# Patient Record
Sex: Male | Born: 1990 | Race: Black or African American | Hispanic: No | Marital: Single | Smoking: Current every day smoker
Health system: Southern US, Community
[De-identification: ages and names within clinical notes are randomized; demographics above are authoritative.]

## PROBLEM LIST (undated history)

## (undated) DIAGNOSIS — Z720 Tobacco use: Secondary | ICD-10-CM

---

## 2011-01-13 ENCOUNTER — Emergency Department (HOSPITAL_COMMUNITY)
Admission: EM | Admit: 2011-01-13 | Discharge: 2011-01-13 | Disposition: A | Discharge status: Home / Self Care | Payer: Self-pay | Attending: Emergency Medicine | Admitting: Emergency Medicine

## 2011-01-13 DIAGNOSIS — Z0389 Encounter for observation for other suspected diseases and conditions ruled out: Secondary | ICD-10-CM | POA: Insufficient documentation

## 2013-06-11 ENCOUNTER — Encounter (HOSPITAL_COMMUNITY): Payer: Self-pay | Admitting: Emergency Medicine

## 2013-06-11 ENCOUNTER — Emergency Department (HOSPITAL_COMMUNITY)
Admission: EM | Admit: 2013-06-11 | Discharge: 2013-06-11 | Disposition: A | Discharge status: Home / Self Care | Payer: BC Managed Care – PPO | Attending: Emergency Medicine | Admitting: Emergency Medicine

## 2013-06-11 DIAGNOSIS — R112 Nausea with vomiting, unspecified: Secondary | ICD-10-CM | POA: Insufficient documentation

## 2013-06-11 DIAGNOSIS — R109 Unspecified abdominal pain: Secondary | ICD-10-CM | POA: Insufficient documentation

## 2013-06-11 DIAGNOSIS — K529 Noninfective gastroenteritis and colitis, unspecified: Secondary | ICD-10-CM

## 2013-06-11 DIAGNOSIS — K5289 Other specified noninfective gastroenteritis and colitis: Secondary | ICD-10-CM | POA: Insufficient documentation

## 2013-06-11 DIAGNOSIS — R197 Diarrhea, unspecified: Secondary | ICD-10-CM

## 2013-06-11 LAB — CBC WITH DIFFERENTIAL/PLATELET
Basophils Absolute: 0 10*3/uL (ref 0.0–0.1)
Basophils Relative: 0 % (ref 0–1)
Eosinophils Absolute: 0 10*3/uL (ref 0.0–0.7)
Eosinophils Relative: 0 % (ref 0–5)
Lymphocytes Relative: 5 % — ABNORMAL LOW (ref 12–46)
MCV: 79 fL (ref 78.0–100.0)
Platelets: 246 10*3/uL (ref 150–400)
RDW: 11.9 % (ref 11.5–15.5)
WBC: 10.6 10*3/uL — ABNORMAL HIGH (ref 4.0–10.5)

## 2013-06-11 LAB — COMPREHENSIVE METABOLIC PANEL
ALT: 11 U/L (ref 0–53)
AST: 22 U/L (ref 0–37)
Albumin: 4.4 g/dL (ref 3.5–5.2)
CO2: 23 mEq/L (ref 19–32)
Calcium: 9.9 mg/dL (ref 8.4–10.5)
Sodium: 135 mEq/L (ref 135–145)
Total Protein: 7.4 g/dL (ref 6.0–8.3)

## 2013-06-11 MED ORDER — ONDANSETRON HCL 4 MG/2ML IJ SOLN
4.0000 mg | Freq: Once | INTRAMUSCULAR | Status: AC
  Administered 2013-06-11: 4 mg via INTRAVENOUS
  Filled 2013-06-11: qty 2

## 2013-06-11 MED ORDER — HYDROMORPHONE HCL PF 1 MG/ML IJ SOLN
1.0000 mg | Freq: Once | INTRAMUSCULAR | Status: AC
  Administered 2013-06-11: 1 mg via INTRAVENOUS
  Filled 2013-06-11: qty 1

## 2013-06-11 MED ORDER — SODIUM CHLORIDE 0.9 % IV BOLUS (SEPSIS)
1000.0000 mL | Freq: Once | INTRAVENOUS | Status: AC
  Administered 2013-06-11: 1000 mL via INTRAVENOUS

## 2013-06-11 MED ORDER — ONDANSETRON 4 MG PO TBDP: 4.0000 mg | ORAL_TABLET | Freq: Three times a day (TID) | ORAL | Status: DC | PRN

## 2013-06-11 MED ORDER — TRIAMCINOLONE ACETONIDE 0.1 % EX CREA: TOPICAL_CREAM | Freq: Two times a day (BID) | CUTANEOUS | Status: DC

## 2013-06-11 NOTE — ED Provider Notes (Signed)
CSN: [id number]     Arrival date & time 06/11/13  0840 History     First MD Initiated Contact with Patient 06/11/13 [phone]     Chief Complaint  Patient presents with  . Nausea  . Emesis  . Abdominal Pain   (Consider location/radiation/quality/duration/timing/severity/associated sxs/prior Treatment) The history is provided by the patient and medical records.   Pt presents to the ED for sudden onset of persistent nausea, non-bloody, non-bilious vomiting, non-bloody diarrhea, and abdominal pain since approximately 2 AM. Pain described as sharp, and causing him to have difficulty finding a comfortable position.  No new medications.  Pt states he did eat some "new food" that his friends mother made-- unsure what it is but states it was "from their country".  No fevers, sweats, chills.  No urinary sx or flank pain.  No prior abdominal surgeries.  No recent EtOH or abx.  Pt also complains of unrelated possible allergic reaction.  States over the past few days he has noticed small, pruritic "bumps" appearing on BUE and trunk.  Pt wears long sleeves at work.  No changes in soaps or detergents.  Questioned [facility] and they deny changes in [employer] as well.  No one at home with similar rash.  No hotel stays.  No tick exposure.     No past medical history on file. No past surgical history on file. No family history on file. History  Substance Use Topics  . Smoking status: Not on file  . Smokeless tobacco: Not on file  . Alcohol Use: Not on file    Review of Systems  Gastrointestinal: Positive for nausea, vomiting, abdominal pain and diarrhea.  All other systems reviewed and are negative.    Allergies  Review of patient's allergies indicates no known allergies.  Home Medications  No current outpatient prescriptions on file. BP 102/63  Pulse 78  Temp(Src) 98.3 F (36.8 C) (Oral)  Resp 20  SpO2 100%  Physical Exam  Nursing note and vitals  reviewed. Constitutional: He is oriented to person, place, and time. He appears well-developed and well-nourished. No distress.  Appears uncomfortable  HENT:  Head: Normocephalic and atraumatic.  Mouth/Throat: Oropharynx is clear and moist.  Eyes: Conjunctivae and EOM are normal. Pupils are equal, round, and reactive to light.  Neck: Normal range of motion.  Cardiovascular: Normal rate, regular rhythm and normal heart sounds.   Pulmonary/Chest: Effort normal and breath sounds normal.  Abdominal: Soft. Normal appearance and bowel sounds are normal. There is tenderness in the right lower quadrant and left lower quadrant. There is no guarding, no CVA tenderness, no tenderness at McBurney's point and negative Murphy's sign.  Musculoskeletal: Normal range of motion.  Neurological: He is alert and oriented to person, place, and time.  Skin: Skin is warm and dry. No ecchymosis and no petechiae noted. No erythema.  Rash consistent with contact dermatitis on BUE and trunk, pruritic; no bites, petechia, or other lesions noted  Psychiatric: He has a normal mood and affect.    ED Course   Procedures (including critical care time)  Labs Reviewed  CBC WITH DIFFERENTIAL - Abnormal; Notable for the following:    WBC 10.6 (*)    Neutrophils Relative % 92 (*)    Neutro Abs 9.8 (*)    Lymphocytes Relative 5 (*)    Lymphs Abs 0.6 (*)    Monocytes Relative 2 (*)    All other components within normal limits  COMPREHENSIVE METABOLIC PANEL - Abnormal; Notable  for the following:    Glucose, Bld 135 (*)    All other components within normal limits  LIPASE, BLOOD   No results found. 1. Gastroenteritis   2. Nausea & vomiting   3. Diarrhea     MDM   Baseline labs, IVF, zofran, and dilaudid ordered.  Will re-assess.  10:29 AM Pt re-assessed, sleeping in bed.  Woke pt, states he is feeling much better, no further vomiting or abdominal pain.  Repeat abdominal exam unremarkable.  Sx likely due to food  related gastroenteritis, labs reassuring.  Will initiate PO challenge.  Pt tolerated PO without difficulty, no recurrent vomiting, VS remained stable. Doubt acute/surgical abdomen.  Pt will be d/c with zofran and triamcinolone for likely contact dermatitis-- instructed he may take OTC benadryl to help with itching.  FU with [facility] as needed.  Discussed plan with pt and mom, they agreed.  Return precautions advised.  Garlon Hatchet, PA-C 06/11/13 1219

## 2013-06-11 NOTE — ED Notes (Signed)
Pt c/o of lower abd pain, nausea, vomiting diarrhea that started this morning. Weakness, lethargic. Denies blood in urine or stool.

## 2013-06-13 NOTE — ED Provider Notes (Signed)
Medical screening examination/treatment/procedure(s) were performed by non-physician practitioner and as supervising physician I was immediately available for consultation/collaboration.  Candyce Churn, MD 06/13/13 [phone]

## 2014-05-17 ENCOUNTER — Emergency Department (HOSPITAL_COMMUNITY)
Admission: EM | Admit: 2014-05-17 | Discharge: 2014-05-17 | Disposition: A | Discharge status: Home / Self Care | Payer: BC Managed Care – PPO | Attending: Emergency Medicine | Admitting: Emergency Medicine

## 2014-05-17 ENCOUNTER — Encounter (HOSPITAL_COMMUNITY): Payer: Self-pay | Admitting: Emergency Medicine

## 2014-05-17 DIAGNOSIS — Z79899 Other long term (current) drug therapy: Secondary | ICD-10-CM | POA: Insufficient documentation

## 2014-05-17 DIAGNOSIS — R197 Diarrhea, unspecified: Secondary | ICD-10-CM | POA: Insufficient documentation

## 2014-05-17 DIAGNOSIS — R1084 Generalized abdominal pain: Secondary | ICD-10-CM | POA: Insufficient documentation

## 2014-05-17 DIAGNOSIS — R112 Nausea with vomiting, unspecified: Secondary | ICD-10-CM | POA: Insufficient documentation

## 2014-05-17 DIAGNOSIS — F17200 Nicotine dependence, unspecified, uncomplicated: Secondary | ICD-10-CM | POA: Insufficient documentation

## 2014-05-17 LAB — CBC WITH DIFFERENTIAL/PLATELET
BASOS ABS: 0 10*3/uL (ref 0.0–0.1)
BASOS PCT: 0 % (ref 0–1)
Eosinophils Absolute: 0 10*3/uL (ref 0.0–0.7)
Eosinophils Relative: 0 % (ref 0–5)
HEMATOCRIT: 43.1 % (ref 39.0–52.0)
Hemoglobin: 14.6 g/dL (ref 13.0–17.0)
LYMPHS PCT: 9 % — AB (ref 12–46)
Lymphs Abs: 0.7 10*3/uL (ref 0.7–4.0)
MCH: 27.2 pg (ref 26.0–34.0)
MCHC: 33.9 g/dL (ref 30.0–36.0)
MCV: 80.3 fL (ref 78.0–100.0)
MONO ABS: 0.2 10*3/uL (ref 0.1–1.0)
Monocytes Relative: 3 % (ref 3–12)
NEUTROS ABS: 6.8 10*3/uL (ref 1.7–7.7)
NEUTROS PCT: 88 % — AB (ref 43–77)
PLATELETS: 253 10*3/uL (ref 150–400)
RBC: 5.37 MIL/uL (ref 4.22–5.81)
RDW: 11.9 % (ref 11.5–15.5)
WBC: 7.7 10*3/uL (ref 4.0–10.5)

## 2014-05-17 LAB — COMPREHENSIVE METABOLIC PANEL
ALBUMIN: 4.8 g/dL (ref 3.5–5.2)
ALT: 13 U/L (ref 0–53)
AST: 26 U/L (ref 0–37)
Alkaline Phosphatase: 55 U/L (ref 39–117)
Anion gap: 18 — ABNORMAL HIGH (ref 5–15)
BILIRUBIN TOTAL: 0.7 mg/dL (ref 0.3–1.2)
BUN: 8 mg/dL (ref 6–23)
CHLORIDE: 99 meq/L (ref 96–112)
CO2: 21 mEq/L (ref 19–32)
CREATININE: 0.92 mg/dL (ref 0.50–1.35)
Calcium: 10.2 mg/dL (ref 8.4–10.5)
GFR calc Af Amer: >90 mL/min (ref >90)
GFR calc non Af Amer: >90 mL/min (ref >90)
Glucose, Bld: 112 mg/dL — ABNORMAL HIGH (ref 70–99)
Potassium: 3.7 mEq/L (ref 3.7–5.3)
SODIUM: 138 meq/L (ref 137–147)
Total Protein: 8 g/dL (ref 6.0–8.3)

## 2014-05-17 LAB — LIPASE, BLOOD: LIPASE: 48 U/L (ref 11–59)

## 2014-05-17 MED ORDER — MORPHINE SULFATE 4 MG/ML IJ SOLN
4.0000 mg | Freq: Once | INTRAMUSCULAR | Status: AC
  Administered 2014-05-17: 4 mg via INTRAVENOUS
  Filled 2014-05-17: qty 1

## 2014-05-17 MED ORDER — ONDANSETRON HCL 4 MG PO TABS: 4.0000 mg | ORAL_TABLET | Freq: Four times a day (QID) | ORAL | Status: DC

## 2014-05-17 MED ORDER — ONDANSETRON HCL 4 MG/2ML IJ SOLN
4.0000 mg | Freq: Once | INTRAMUSCULAR | Status: AC
  Administered 2014-05-17: 4 mg via INTRAVENOUS
  Filled 2014-05-17: qty 2

## 2014-05-17 MED ORDER — SODIUM CHLORIDE 0.9 % IV BOLUS (SEPSIS)
1000.0000 mL | Freq: Once | INTRAVENOUS | Status: AC
  Administered 2014-05-17: 1000 mL via INTRAVENOUS

## 2014-05-17 NOTE — ED Provider Notes (Signed)
CSN: [id number]     Arrival date & time 05/17/14  1143 History   First MD Initiated Contact with Patient 05/17/14 1206     Chief Complaint  Patient presents with  . Vomiting     (Consider location/radiation/quality/duration/timing/severity/associated sxs/prior Treatment) HPI Comments: Patient presents to the emergency department with chief complaint of nausea, vomiting, and diarrhea. He states that he started vomiting last night at around 2 AM. He denies any sick contacts. Denies any fevers or chills. Denies any dysuria. Denies eating or drinking anything irregular. Denies any history of abdominal surgery. States his abdomen hurts all over, but more so in the upper left quadrant. The pain does not radiate. It is constant and cramping.  The history is provided by the patient. No language interpreter was used.    No past medical history on file. No past surgical history on file. History reviewed. No pertinent family history. History  Substance Use Topics  . Smoking status: Current Every Day Smoker  . Smokeless tobacco: Not on file  . Alcohol Use: Yes     Comment: "light social drinking"    Review of Systems  All other systems reviewed and are negative.     Allergies  Review of patient's allergies indicates no known allergies.  Home Medications   Prior to Admission medications   Medication Sig Start Date End Date Taking? Authorizing Provider  ondansetron (ZOFRAN ODT) 4 MG disintegrating tablet Take 1 tablet (4 mg total) by mouth every 8 (eight) hours as needed for nausea. 06/11/13   Garlon Hatchet, PA-C  triamcinolone cream (KENALOG) 0.1 % Apply topically 2 (two) times daily. Do not apply to face. 06/11/13   Garlon Hatchet, PA-C   BP 104/68  Pulse 64  Temp(Src) 99.3 F (37.4 C) (Oral)  Resp 16  SpO2 100% Physical Exam  Nursing note and vitals reviewed. Constitutional: He is oriented to person, place, and time. He appears well-developed and well-nourished.  HENT:  Head:  Normocephalic and atraumatic.  Eyes: Conjunctivae and EOM are normal. Pupils are equal, round, and reactive to light. Right eye exhibits no discharge. Left eye exhibits no discharge. No scleral icterus.  Neck: Normal range of motion. Neck supple. No JVD present.  Cardiovascular: Normal rate, regular rhythm and normal heart sounds.  Exam reveals no gallop and no friction rub.   No murmur heard. Pulmonary/Chest: Effort normal and breath sounds normal. No respiratory distress. He has no wheezes. He has no rales. He exhibits no tenderness.  Abdominal: Soft. He exhibits no distension and no mass. There is tenderness. There is no rebound and no guarding.  Diffuse abdominal discomfort, more so in the upper left quadrant, no pain at McBurney's point, no left lower quadrant tenderness, no Murphy's sign  Musculoskeletal: Normal range of motion. He exhibits no edema and no tenderness.  Neurological: He is alert and oriented to person, place, and time.  Skin: Skin is warm and dry.  Psychiatric: He has a normal mood and affect. His behavior is normal. Judgment and thought content normal.    ED Course  Procedures (including critical care time) Results for orders placed during the hospital encounter of 05/17/14  CBC WITH DIFFERENTIAL      Result Value Ref Range   WBC 7.7  4.0 - 10.5 K/uL   RBC 5.37  4.22 - 5.81 MIL/uL   Hemoglobin 14.6  13.0 - 17.0 g/dL   HCT 14.7  82.9 - 56.2 %   MCV 80.3  78.0 - 100.0 fL  MCH 27.2  26.0 - 34.0 pg   MCHC 33.9  30.0 - 36.0 g/dL   RDW 10.211.9  72.511.5 - 36.615.5 %   Platelets 253  150 - 400 K/uL   Neutrophils Relative % 88 (*) 43 - 77 %   Neutro Abs 6.8  1.7 - 7.7 K/uL   Lymphocytes Relative 9 (*) 12 - 46 %   Lymphs Abs 0.7  0.7 - 4.0 K/uL   Monocytes Relative 3  3 - 12 %   Monocytes Absolute 0.2  0.1 - 1.0 K/uL   Eosinophils Relative 0  0 - 5 %   Eosinophils Absolute 0.0  0.0 - 0.7 K/uL   Basophils Relative 0  0 - 1 %   Basophils Absolute 0.0  0.0 - 0.1 K/uL   COMPREHENSIVE METABOLIC PANEL      Result Value Ref Range   Sodium 138  137 - 147 mEq/L   Potassium 3.7  3.7 - 5.3 mEq/L   Chloride 99  96 - 112 mEq/L   CO2 21  19 - 32 mEq/L   Glucose, Bld 112 (*) 70 - 99 mg/dL   BUN 8  6 - 23 mg/dL   Creatinine, Ser 4.400.92  0.50 - 1.35 mg/dL   Calcium 34.710.2  8.4 - 42.510.5 mg/dL   Total Protein 8.0  6.0 - 8.3 g/dL   Albumin 4.8  3.5 - 5.2 g/dL   AST 26  0 - 37 U/L   ALT 13  0 - 53 U/L   Alkaline Phosphatase 55  39 - 117 U/L   Total Bilirubin 0.7  0.3 - 1.2 mg/dL   GFR calc non Af Amer >90  >90 mL/min   GFR calc Af Amer >90  >90 mL/min   Anion gap 18 (*) 5 - 15  LIPASE, BLOOD      Result Value Ref Range   Lipase 48  11 - 59 U/L     Imaging Review No results found.   EKG Interpretation None      MDM   Final diagnoses:  Nausea vomiting and diarrhea    Patient with nausea, vomiting, diarrhea. Will check labs, give fluids, treat pain, and nausea. Will reassess.  1:43 PM Labs are reassuring. Patient feels better with treatment. Abdomen is benign on reexam. Discharged to home with Zofran.  Seen by and discussed with Dr. Denton LankSteinl.    Roxy Horsemanobert Clarise Chacko, PA-C 05/17/14 1343

## 2014-05-17 NOTE — ED Notes (Signed)
He c/o n/v numerous times since ~0200 today.  He also states he has had three "loose stools" today.  He is in no distress.  He denies fever, nor knowing any other person in his sphere with similar s/s.

## 2014-05-17 NOTE — Discharge Instructions (Signed)
Gastroenteritis Viral gastroenteritis is also known as stomach flu. This condition affects the stomach and intestinal tract. It can cause sudden diarrhea and vomiting. The illness typically lasts 3 to 8 days. Most people develop an immune response that eventually gets rid of the virus. While this natural response develops, the virus can make you quite ill. CAUSES  Many different viruses can cause gastroenteritis, such as rotavirus or noroviruses. You can catch one of these viruses by consuming contaminated food or water. You may also catch a virus by sharing utensils or other personal items with an infected person or by touching a contaminated surface. SYMPTOMS  The most common symptoms are diarrhea and vomiting. These problems can cause a severe loss of body fluids (dehydration) and a body salt (electrolyte) imbalance. Other symptoms may include:  Fever.  Headache.  Fatigue.  Abdominal pain. DIAGNOSIS  Your caregiver can usually diagnose viral gastroenteritis based on your symptoms and a physical exam. A stool sample may also be taken to test for the presence of viruses or other infections. TREATMENT  This illness typically goes away on its own. Treatments are aimed at rehydration. The most serious cases of viral gastroenteritis involve vomiting so severely that you are not able to keep fluids down. In these cases, fluids must be given through an intravenous line (IV). HOME CARE INSTRUCTIONS   Drink enough fluids to keep your urine clear or pale yellow. Drink small amounts of fluids frequently and increase the amounts as tolerated.  Ask your caregiver for specific rehydration instructions.  Avoid:  Foods high in sugar.  Alcohol.  Carbonated drinks.  Tobacco.  Juice.  Caffeine drinks.  Extremely hot or cold fluids.  Fatty, greasy foods.  Too much intake of anything at one time.  Dairy products until 24 to 48 hours after diarrhea stops.  You may consume probiotics.  Probiotics are active cultures of beneficial bacteria. They may lessen the amount and number of diarrheal stools in adults. Probiotics can be found in yogurt with active cultures and in supplements.  Wash your hands well to avoid spreading the virus.  Only take over-the-counter or prescription medicines for pain, discomfort, or fever as directed by your caregiver. Do not give aspirin to children. Antidiarrheal medicines are not recommended.  Ask your caregiver if you should continue to take your regular prescribed and over-the-counter medicines.  Keep all follow-up appointments as directed by your caregiver. SEEK IMMEDIATE MEDICAL CARE IF:   You are unable to keep fluids down.  You do not urinate at least once every 6 to 8 hours.  You develop shortness of breath.  You notice blood in your stool or vomit. This may look like coffee grounds.  You have abdominal pain that increases or is concentrated in one small area (localized).  You have persistent vomiting or diarrhea.  You have a fever.  The patient is a child younger than 3 months, and he or she has a fever.  The patient is a child older than 3 months, and he or she has a fever and persistent symptoms.  The patient is a child older than 3 months, and he or she has a fever and symptoms suddenly get worse.  The patient is a baby, and he or she has no tears when crying. MAKE SURE YOU:   Understand these instructions.  Will watch your condition.  Will get help right away if you are not doing well or get worse. Document Released: 10/24/2005 Document Revised: 01/16/2012 Document Reviewed: 08/10/2011 [employer]  Patient Information ©2015 [employer]. This information is not intended to replace advice given to you by your health care provider. Make sure you discuss any questions you have with your health care provider. ° °

## 2014-05-17 NOTE — ED Provider Notes (Signed)
Medical screening examination/treatment/procedure(s) were conducted as a shared visit with non-physician practitioner(s) and myself.  I personally evaluated the patient during the encounter.  Pt c/o nv. No bilious or bloody emesis. Improved w ivf. abd soft nt. No pain. No recurrent nv.    Suzi RootsKevin E Jericho Alcorn, MD 05/17/14 [phone]

## 2014-05-17 NOTE — ED Notes (Signed)
Initial contact-pt A&Ox4. Moving all extremities equally. Guarding abdomen. C/o N, V started at 0200 this morning. Denies blood in vomit, diarrhea. Has not been around anyone sick. PA at bedside.

## 2014-05-22 ENCOUNTER — Emergency Department (HOSPITAL_COMMUNITY): Payer: BC Managed Care – PPO

## 2014-05-22 ENCOUNTER — Emergency Department (HOSPITAL_COMMUNITY)
Admission: EM | Admit: 2014-05-22 | Discharge: 2014-05-23 | Disposition: A | Discharge status: Home / Self Care | Payer: BC Managed Care – PPO | Attending: Emergency Medicine | Admitting: Emergency Medicine

## 2014-05-22 ENCOUNTER — Encounter (HOSPITAL_COMMUNITY): Payer: Self-pay | Admitting: Emergency Medicine

## 2014-05-22 DIAGNOSIS — K2990 Gastroduodenitis, unspecified, without bleeding: Principal | ICD-10-CM

## 2014-05-22 DIAGNOSIS — F17200 Nicotine dependence, unspecified, uncomplicated: Secondary | ICD-10-CM | POA: Insufficient documentation

## 2014-05-22 DIAGNOSIS — Z79899 Other long term (current) drug therapy: Secondary | ICD-10-CM | POA: Insufficient documentation

## 2014-05-22 DIAGNOSIS — R1013 Epigastric pain: Secondary | ICD-10-CM

## 2014-05-22 DIAGNOSIS — R112 Nausea with vomiting, unspecified: Secondary | ICD-10-CM

## 2014-05-22 DIAGNOSIS — K2970 Gastritis, unspecified, without bleeding: Secondary | ICD-10-CM | POA: Insufficient documentation

## 2014-05-22 LAB — CBC
HEMATOCRIT: 45.2 % (ref 39.0–52.0)
HEMOGLOBIN: 15.9 g/dL (ref 13.0–17.0)
MCH: 27.9 pg (ref 26.0–34.0)
MCHC: 35.2 g/dL (ref 30.0–36.0)
MCV: 79.3 fL (ref 78.0–100.0)
Platelets: 264 10*3/uL (ref 150–400)
RBC: 5.7 MIL/uL (ref 4.22–5.81)
RDW: 11.7 % (ref 11.5–15.5)
WBC: 5 10*3/uL (ref 4.0–10.5)

## 2014-05-22 LAB — COMPREHENSIVE METABOLIC PANEL
ALK PHOS: 54 U/L (ref 39–117)
ALT: 10 U/L (ref 0–53)
ANION GAP: 14 (ref 5–15)
AST: 17 U/L (ref 0–37)
Albumin: 4.8 g/dL (ref 3.5–5.2)
BILIRUBIN TOTAL: 1 mg/dL (ref 0.3–1.2)
BUN: 17 mg/dL (ref 6–23)
CHLORIDE: 97 meq/L (ref 96–112)
CO2: 27 mEq/L (ref 19–32)
Calcium: 10.4 mg/dL (ref 8.4–10.5)
Creatinine, Ser: 1.17 mg/dL (ref 0.50–1.35)
GFR calc non Af Amer: 86 mL/min — ABNORMAL LOW (ref >90)
GLUCOSE: 90 mg/dL (ref 70–99)
POTASSIUM: 3.9 meq/L (ref 3.7–5.3)
Sodium: 138 mEq/L (ref 137–147)
Total Protein: 8.4 g/dL — ABNORMAL HIGH (ref 6.0–8.3)

## 2014-05-22 LAB — LIPASE, BLOOD: Lipase: 76 U/L — ABNORMAL HIGH (ref 11–59)

## 2014-05-22 MED ORDER — PANTOPRAZOLE SODIUM 40 MG IV SOLR
40.0000 mg | Freq: Once | INTRAVENOUS | Status: AC
  Administered 2014-05-23: 40 mg via INTRAVENOUS
  Filled 2014-05-22: qty 40

## 2014-05-22 MED ORDER — PANTOPRAZOLE SODIUM 20 MG PO TBEC: 20.0000 mg | DELAYED_RELEASE_TABLET | Freq: Every day | ORAL | Status: DC

## 2014-05-22 MED ORDER — DICYCLOMINE HCL 10 MG/ML IM SOLN
20.0000 mg | Freq: Once | INTRAMUSCULAR | Status: AC
  Administered 2014-05-22: 20 mg via INTRAMUSCULAR
  Filled 2014-05-22: qty 2

## 2014-05-22 MED ORDER — SODIUM CHLORIDE 0.9 % IV BOLUS (SEPSIS)
1000.0000 mL | Freq: Once | INTRAVENOUS | Status: AC
  Administered 2014-05-22: 1000 mL via INTRAVENOUS

## 2014-05-22 MED ORDER — IOHEXOL 300 MG/ML  SOLN
100.0000 mL | Freq: Once | INTRAMUSCULAR | Status: AC | PRN
  Administered 2014-05-22: 100 mL via INTRAVENOUS

## 2014-05-22 MED ORDER — ONDANSETRON 4 MG PO TBDP: ORAL_TABLET | ORAL | Status: DC

## 2014-05-22 MED ORDER — ONDANSETRON HCL 4 MG/2ML IJ SOLN
4.0000 mg | Freq: Once | INTRAMUSCULAR | Status: AC
  Administered 2014-05-22: 4 mg via INTRAVENOUS
  Filled 2014-05-22: qty 2

## 2014-05-22 NOTE — Discharge Instructions (Signed)
Take protonix as directed. Take zofran as directed as needed for nausea.  Gastritis, Adult Gastritis is soreness and swelling (inflammation) of the lining of the stomach. Gastritis can develop as a sudden onset (acute) or long-term (chronic) condition. If gastritis is not treated, it can lead to stomach bleeding and ulcers. CAUSES  Gastritis occurs when the stomach lining is weak or damaged. Digestive juices from the stomach then inflame the weakened stomach lining. The stomach lining may be weak or damaged due to viral or bacterial infections. One common bacterial infection is the Helicobacter pylori infection. Gastritis can also result from excessive alcohol consumption, taking certain medicines, or having too much acid in the stomach.  SYMPTOMS  In some cases, there are no symptoms. When symptoms are present, they may include:  Pain or a burning sensation in the upper abdomen.  Nausea.  Vomiting.  An uncomfortable feeling of fullness after eating. DIAGNOSIS  Your caregiver may suspect you have gastritis based on your symptoms and a physical exam. To determine the cause of your gastritis, your caregiver may perform the following:  Blood or stool tests to check for the H pylori bacterium.  Gastroscopy. A thin, flexible tube (endoscope) is passed down the esophagus and into the stomach. The endoscope has a light and camera on the end. Your caregiver uses the endoscope to view the inside of the stomach.  Taking a tissue sample (biopsy) from the stomach to examine under a microscope. TREATMENT  Depending on the cause of your gastritis, medicines may be prescribed. If you have a bacterial infection, such as an H pylori infection, antibiotics may be given. If your gastritis is caused by too much acid in the stomach, H2 blockers or antacids may be given. Your caregiver may recommend that you stop taking aspirin, ibuprofen, or other nonsteroidal anti-inflammatory drugs (NSAIDs). HOME CARE  INSTRUCTIONS  Only take over-the-counter or prescription medicines as directed by your caregiver.  If you were given antibiotic medicines, take them as directed. Finish them even if you start to feel better.  Drink enough fluids to keep your urine clear or pale yellow.  Avoid foods and drinks that make your symptoms worse, such as:  Caffeine or alcoholic drinks.  Chocolate.  Peppermint or mint flavorings.  Garlic and onions.  Spicy foods.  Citrus fruits, such as oranges, lemons, or limes.  Tomato-based foods such as sauce, chili, salsa, and pizza.  Fried and fatty foods.  Eat small, frequent meals instead of large meals. SEEK IMMEDIATE MEDICAL CARE IF:   You have black or dark red stools.  You vomit blood or material that looks like coffee grounds.  You are unable to keep fluids down.  Your abdominal pain gets worse.  You have a fever.  You do not feel better after 1 week.  You have any other questions or concerns. MAKE SURE YOU:  Understand these instructions.  Will watch your condition.  Will get help right away if you are not doing well or get worse. Document Released: 10/18/2001 Document Revised: 04/24/2012 Document Reviewed: 12/07/2011 [facility] Patient Information 2015 [place], [place]. This information is not intended to replace advice given to you by your health care provider. Make sure you discuss any questions you have with your health care provider.  Abdominal Pain Many things can cause abdominal pain. Usually, abdominal pain is not caused by a disease and will improve without treatment. It can often be observed and treated at home. Your health care provider will do a physical exam and  possibly order blood tests and X-rays to help determine the seriousness of your pain. However, in many cases, more time must pass before a clear cause of the pain can be found. Before that point, your health care provider may not know if you need more testing or further  treatment. HOME CARE INSTRUCTIONS  Monitor your abdominal pain for any changes. The following actions may help to alleviate any discomfort you are experiencing:  Only take over-the-counter or prescription medicines as directed by your health care provider.  Do not take laxatives unless directed to do so by your health care provider.  Try a clear liquid diet (broth, tea, or water) as directed by your health care provider. Slowly move to a bland diet as tolerated. SEEK MEDICAL CARE IF:  You have unexplained abdominal pain.  You have abdominal pain associated with nausea or diarrhea.  You have pain when you urinate or have a bowel movement.  You experience abdominal pain that wakes you in the night.  You have abdominal pain that is worsened or improved by eating food.  You have abdominal pain that is worsened with eating fatty foods.  You have a fever. SEEK IMMEDIATE MEDICAL CARE IF:   Your pain does not go away within 2 hours.  You keep throwing up (vomiting).  Your pain is felt only in portions of the abdomen, such as the right side or the left lower portion of the abdomen.  You pass bloody or black tarry stools. MAKE SURE YOU:  Understand these instructions.   Will watch your condition.   Will get help right away if you are not doing well or get worse.  Document Released: 08/03/2005 Document Revised: 10/29/2013 Document Reviewed: 07/03/2013 [facility] Patient Information 2015 [employer], [place]. This information is not intended to replace advice given to you by your health care provider. Make sure you discuss any questions you have with your health care provider.

## 2014-05-22 NOTE — ED Notes (Signed)
Pt states he was here last Saturday with same symptoms of vomiting. Pt states for the past week he has been unable to keep any foods or liquids down. States he has been vomiting 4 times a day everyday for a week. Pt reports he was given zofran to take at home and med was ineffective. Pt states he is having pain in his abdomen in which he rates 5/10.

## 2014-05-22 NOTE — ED Provider Notes (Signed)
CSN: [id number]     Arrival date & time 05/22/14  1954 History   First MD Initiated Contact with Patient 05/22/14 2005     Chief Complaint  Patient presents with  . Emesis     (Consider location/radiation/quality/duration/timing/severity/associated sxs/prior Treatment) HPI Comments: 23 year old male with no significant past medical history presents to the emergency department complaining of continued abdominal pain, nausea and vomiting x5 days. Patient was seen in the emergency Department 5 days ago for the same symptoms. He had a normal workup, was rehydrated and sent home with Zofran. Patient states he has been experiencing intermittent cramping, mid epigastric abdominal pain, nonradiating with associated nausea and vomiting. States he has had about 4-5 episodes of nonbloody, nonbilious emesis over the past 5 days. He tried to use Zofran with no relief. Pain currently related 5/10. Denies fever, chills, diarrhea. No contacts with similar symptoms. He had a bowl of oatmeal followed by a bottle of water earlier today, however vomited shortly after. Denies any alcohol use.  Patient is a 23 y.o. male presenting with vomiting. The history is provided by the patient.  Emesis Associated symptoms: abdominal pain     History reviewed. No pertinent past medical history. History reviewed. No pertinent past surgical history. History reviewed. No pertinent family history. History  Substance Use Topics  . Smoking status: Current Every Day Smoker  . Smokeless tobacco: Not on file  . Alcohol Use: Yes     Comment: "light social drinking"    Review of Systems  Gastrointestinal: Positive for nausea, vomiting and abdominal pain.  All other systems reviewed and are negative.     Allergies  Review of patient's allergies indicates no known allergies.  Home Medications   Prior to Admission medications   Medication Sig Start Date End Date Taking? Authorizing Provider  ondansetron (ZOFRAN) 4 MG  tablet Take 1 tablet (4 mg total) by mouth every 6 (six) hours. 05/17/14  Yes Roxy Horsemanobert Browning, PA-C  ondansetron (ZOFRAN ODT) 4 MG disintegrating tablet 4mg  ODT q4 hours prn nausea/vomit 05/22/14   Trevor Maceobyn M Albert, PA-C  pantoprazole (PROTONIX) 20 MG tablet Take 1 tablet (20 mg total) by mouth daily. 05/22/14   Trevor Maceobyn M Albert, PA-C   BP 92/73  Pulse 87  Temp(Src) 98.7 F (37.1 C) (Oral)  Resp 18  SpO2 100% Physical Exam  Nursing note and vitals reviewed. Constitutional: He is oriented to person, place, and time. He appears well-developed and well-nourished. No distress.  HENT:  Head: Normocephalic and atraumatic.  Mouth/Throat: Oropharynx is clear and moist.  Eyes: Conjunctivae are normal.  Neck: Normal range of motion. Neck supple.  Cardiovascular: Normal rate, regular rhythm and normal heart sounds.   Pulmonary/Chest: Effort normal and breath sounds normal.  Abdominal: Soft. Normal appearance and bowel sounds are normal. There is generalized tenderness. There is no rigidity, no rebound, no guarding, no tenderness at McBurney's point and negative Murphy's sign.  Generalized abdominal tenderness, worse in midepigastric. No peritoneal signs.  Musculoskeletal: Normal range of motion. He exhibits no edema.  Neurological: He is alert and oriented to person, place, and time.  Skin: Skin is warm and dry. He is not diaphoretic.  Psychiatric: He has a normal mood and affect. His behavior is normal.    ED Course  Procedures (including critical care time) Labs Review Labs Reviewed  COMPREHENSIVE METABOLIC PANEL - Abnormal; Notable for the following:    Total Protein 8.4 (*)    GFR calc non Af Amer 86 (*)  All other components within normal limits  LIPASE, BLOOD - Abnormal; Notable for the following:    Lipase 76 (*)    All other components within normal limits  CBC    Imaging Review Ct Abdomen Pelvis W Contrast  05/22/2014   CLINICAL DATA:  Vomiting.  EXAM: CT ABDOMEN AND PELVIS WITH  CONTRAST  TECHNIQUE: Multidetector CT imaging of the abdomen and pelvis was performed using the standard protocol following bolus administration of intravenous contrast.  CONTRAST:  OMNIPAQUE IOHEXOL 300 MG/ML  SOLN  COMPARISON:  None.  FINDINGS: LUNG BASES: Included view of the lung bases are clear. Visualized heart and pericardium are unremarkable.  SOLID ORGANS: The liver, spleen, gallbladder, pancreas and adrenal glands are unremarkable.  GASTROINTESTINAL TRACT: Mildly thickened gastric cardia without inflammation. The small and large bowel are normal in course and caliber without inflammatory changes. Enteric contrast has not yet reached the distal small bowel. Normal retrocecal appendix.  KIDNEYS/ URINARY TRACT: Kidneys are orthotopic, demonstrating symmetric enhancement. No hydronephrosis or solid renal masses. 4 mm right interpolar nephrolithiasis. The unopacified ureters are normal in course and caliber. Urinary bladder is partially distended and unremarkable.  PERITONEUM/RETROPERITONEUM: No intraperitoneal free fluid nor free air. Aortoiliac vessels are normal in course and caliber. No lymphadenopathy by CT size criteria. Internal reproductive organs are unremarkable.  SOFT TISSUE/OSSEOUS STRUCTURES: Nonsuspicious.  IMPRESSION: Mildly thickened gastric cardia could reflect redundancy or possible gastritis.  Nonobstructing 4 mm right interpolar nephrolithiasis.   Electronically Signed   By: Awilda Metro   On: 05/22/2014 23:39     EKG Interpretation None      MDM   Final diagnoses:  Gastritis  Epigastric pain  Nausea and vomiting, vomiting of unspecified type   Patient presenting back to the emergency department with abdominal pain, nausea and vomiting. He is nontoxic appearing and in no apparent distress. Afebrile, vital signs stable. Abdomen was generalized tenderness, worse in midepigastric, no peritoneal signs. Plan to give IV fluids, control pain and nausea and recheck  labs. 12:00 AM Labs showing increased lipase from last visit, elevated from 48 to 76. CT obtained to evaluate for possible developing pancreatitis. CT scan showing mildly thickened gastric cardia possibly reflecting gastritis. Given patient's symptoms, this is most likely. Patient is tolerating PO. Repeat abdominal exam is improved from initial. Stable for discharge, protonix given through IV, will discharge with protonix and Zofran. Return precautions given. Patient states understanding of treatment care plan and is agreeable.  Trevor Mace, PA-C 05/23/14 0002

## 2014-05-25 NOTE — ED Provider Notes (Signed)
Medical screening examination/treatment/procedure(s) were performed by non-physician practitioner and as supervising physician I was immediately available for consultation/collaboration.  Devere Brem T Rozelle Caudle, MD 05/25/14 0804 

## 2015-03-07 ENCOUNTER — Encounter (HOSPITAL_COMMUNITY): Payer: Self-pay

## 2015-03-07 ENCOUNTER — Emergency Department (HOSPITAL_COMMUNITY)
Admission: EM | Admit: 2015-03-07 | Discharge: 2015-03-08 | Disposition: A | Discharge status: Home / Self Care | Payer: BLUE CROSS/BLUE SHIELD | Attending: Emergency Medicine | Admitting: Emergency Medicine

## 2015-03-07 DIAGNOSIS — Z720 Tobacco use: Secondary | ICD-10-CM | POA: Diagnosis not present

## 2015-03-07 DIAGNOSIS — R1013 Epigastric pain: Secondary | ICD-10-CM

## 2015-03-07 DIAGNOSIS — R109 Unspecified abdominal pain: Secondary | ICD-10-CM | POA: Diagnosis present

## 2015-03-07 DIAGNOSIS — Z79899 Other long term (current) drug therapy: Secondary | ICD-10-CM | POA: Diagnosis not present

## 2015-03-07 DIAGNOSIS — R1084 Generalized abdominal pain: Secondary | ICD-10-CM | POA: Diagnosis not present

## 2015-03-07 DIAGNOSIS — R112 Nausea with vomiting, unspecified: Secondary | ICD-10-CM

## 2015-03-07 LAB — CBC WITH DIFFERENTIAL/PLATELET
BASOS ABS: 0 10*3/uL (ref 0.0–0.1)
Basophils Relative: 0 % (ref 0–1)
EOS PCT: 0 % (ref 0–5)
Eosinophils Absolute: 0 10*3/uL (ref 0.0–0.7)
HEMATOCRIT: 44.5 % (ref 39.0–52.0)
HEMOGLOBIN: 14.7 g/dL (ref 13.0–17.0)
LYMPHS PCT: 24 % (ref 12–46)
Lymphs Abs: 1.2 10*3/uL (ref 0.7–4.0)
MCH: 27.6 pg (ref 26.0–34.0)
MCHC: 33 g/dL (ref 30.0–36.0)
MCV: 83.6 fL (ref 78.0–100.0)
MONO ABS: 0.2 10*3/uL (ref 0.1–1.0)
MONOS PCT: 5 % (ref 3–12)
Neutro Abs: 3.5 10*3/uL (ref 1.7–7.7)
Neutrophils Relative %: 71 % (ref 43–77)
Platelets: 217 10*3/uL (ref 150–400)
RBC: 5.32 MIL/uL (ref 4.22–5.81)
RDW: 12 % (ref 11.5–15.5)
WBC: 4.9 10*3/uL (ref 4.0–10.5)

## 2015-03-07 LAB — COMPREHENSIVE METABOLIC PANEL
ALT: 10 U/L (ref 0–53)
ANION GAP: 6 (ref 5–15)
AST: 24 U/L (ref 0–37)
Albumin: 5 g/dL (ref 3.5–5.2)
Alkaline Phosphatase: 60 U/L (ref 39–117)
BILIRUBIN TOTAL: 1 mg/dL (ref 0.3–1.2)
BUN: 11 mg/dL (ref 6–23)
CALCIUM: 9.5 mg/dL (ref 8.4–10.5)
CHLORIDE: 104 mmol/L (ref 96–112)
CO2: 28 mmol/L (ref 19–32)
CREATININE: 1.18 mg/dL (ref 0.50–1.35)
GFR, EST NON AFRICAN AMERICAN: 85 mL/min — AB (ref >90)
Glucose, Bld: 105 mg/dL — ABNORMAL HIGH (ref 70–99)
Potassium: 4.1 mmol/L (ref 3.5–5.1)
Sodium: 138 mmol/L (ref 135–145)
Total Protein: 7.8 g/dL (ref 6.0–8.3)

## 2015-03-07 MED ORDER — ONDANSETRON HCL 4 MG/2ML IJ SOLN
4.0000 mg | Freq: Once | INTRAMUSCULAR | Status: AC
  Administered 2015-03-07: 4 mg via INTRAVENOUS
  Filled 2015-03-07: qty 2

## 2015-03-07 MED ORDER — SODIUM CHLORIDE 0.9 % IV BOLUS (SEPSIS)
1000.0000 mL | Freq: Once | INTRAVENOUS | Status: AC
  Administered 2015-03-07: 1000 mL via INTRAVENOUS

## 2015-03-07 MED ORDER — MORPHINE SULFATE 4 MG/ML IJ SOLN
4.0000 mg | Freq: Once | INTRAMUSCULAR | Status: AC
  Administered 2015-03-07: 4 mg via INTRAVENOUS
  Filled 2015-03-07: qty 1

## 2015-03-07 NOTE — ED Provider Notes (Signed)
CSN: [id number]     Arrival date & time 03/07/15  2156 History   First MD Initiated Contact with Patient 03/07/15 2229     Chief Complaint  Patient presents with  . Abdominal Pain  . Emesis     (Consider location/radiation/quality/duration/timing/severity/associated sxs/prior Treatment) HPI Comments: Patient presents today with a chief complaint of abdominal pain, nausea, and vomiting.  He reports that symptoms began 2 days ago.  He reports that the pain is diffuse, but worse of the epigastric area.  Pain does not radiate.  He has not taken anything for symptoms prior to arrival.  He denies fever, chills, diarrhea, constipation, urinary symptoms, or scrotal pain/swelling.  Last BM was just prior to arrival, which he reports was normal.  No prior history of abdominal surgeries.    Patient is a 24 y.o. male presenting with abdominal pain and vomiting. The history is provided by the patient.  Abdominal Pain Associated symptoms: vomiting   Emesis Associated symptoms: abdominal pain     History reviewed. No pertinent past medical history. History reviewed. No pertinent past surgical history. No family history on file. History  Substance Use Topics  . Smoking status: Current Every Day Smoker  . Smokeless tobacco: Not on file  . Alcohol Use: Yes     Comment: "light social drinking"    Review of Systems  Gastrointestinal: Positive for vomiting and abdominal pain.  All other systems reviewed and are negative.     Allergies  Review of patient's allergies indicates no known allergies.  Home Medications   Prior to Admission medications   Medication Sig Start Date End Date Taking? Authorizing Provider  ondansetron (ZOFRAN ODT) 4 MG disintegrating tablet  ODT q4 hours prn nausea/vomit 05/22/14   Robyn M Hess, PA-C  ondansetron (ZOFRAN) 4 MG tablet Take 1 tablet (4 mg total) by mouth every 6 (six) hours. 05/17/14   Roxy Horseman, PA-C  pantoprazole (PROTONIX) 20 MG tablet Take 1  tablet (20 mg total) by mouth daily. 05/22/14   Robyn M Hess, PA-C   BP 124/74 mmHg  Pulse 71  Temp(Src) 98.2 F (36.8 C) (Oral)  Resp 18  SpO2 100% Physical Exam  Constitutional: He appears well-developed and well-nourished.  HENT:  Head: Normocephalic and atraumatic.  Cardiovascular: Normal rate, regular rhythm and normal heart sounds.   Pulmonary/Chest: Effort normal and breath sounds normal.  Abdominal: Soft. Bowel sounds are normal. He exhibits no distension and no mass. There is tenderness. There is no rebound, no guarding, no tenderness at McBurney's point and negative Murphy's sign.  Diffuse tenderness to palpation, worse in the epigastrium.    Neurological: He is alert.  Skin: Skin is warm and dry.  Psychiatric: He has a normal mood and affect.  Nursing note and vitals reviewed.   ED Course  Procedures (including critical care time) Labs Review Labs Reviewed  CBC WITH DIFFERENTIAL/PLATELET  COMPREHENSIVE METABOLIC PANEL  URINALYSIS, ROUTINE W REFLEX MICROSCOPIC  LIPASE, BLOOD    Imaging Review No results found.   EKG Interpretation None     1:05 AM Reassessed patient.  Abdomen soft with mild epigastric tenderness to palpation.  No rebound or guarding.  Will fluid challenge and reassess. 1:25 AM Patient tolerating PO liquids without difficulty.   MDM   Final diagnoses:  None   Patient presents today with nausea, vomiting, and abdominal pain.  Onset of symptoms two days ago.  He is afebrile.  Labs today unremarkable.  Abdomen is soft with diffuse tenderness to palpation,  worse in the epigastric area.  No rebound or guarding.  Do not feel that any imaging is indicated at this time.  Nausea improved after given Zofran.  Patient able to tolerate PO liquids.  Patient stable for discharge.  Discharged home with Zofran.  Return precautions given.      Santiago GladHeather Yuleidy Rappleye, PA-C 03/08/15 1048  Linwood DibblesJon Knapp, MD 03/08/15 [phone]

## 2015-03-07 NOTE — ED Notes (Signed)
Pt presents with c/o abdominal pain that started on Thursday of this week. Pt also reports he has also been vomiting since Thursday. Pt denies diarrhea.

## 2015-03-08 LAB — LIPASE, BLOOD: LIPASE: 34 U/L (ref 22–51)

## 2015-03-08 MED ORDER — ONDANSETRON HCL 4 MG PO TABS: 4.0000 mg | ORAL_TABLET | Freq: Four times a day (QID) | ORAL | Status: DC

## 2015-03-08 NOTE — ED Notes (Signed)
Delay in resulting Lipase. Lab called.

## 2015-03-08 NOTE — ED Notes (Signed)
Pt alert,oriented, and ambulatory upon DC. He was advised to follow up with GI Arlyce Dice([place]). Mother driving patient home

## 2015-03-09 ENCOUNTER — Emergency Department (HOSPITAL_COMMUNITY)
Admission: EM | Admit: 2015-03-09 | Discharge: 2015-03-09 | Disposition: A | Discharge status: Home / Self Care | Payer: BLUE CROSS/BLUE SHIELD | Attending: Emergency Medicine | Admitting: Emergency Medicine

## 2015-03-09 ENCOUNTER — Emergency Department (HOSPITAL_COMMUNITY): Payer: BLUE CROSS/BLUE SHIELD

## 2015-03-09 ENCOUNTER — Encounter (HOSPITAL_COMMUNITY): Payer: Self-pay | Admitting: Emergency Medicine

## 2015-03-09 DIAGNOSIS — Z79899 Other long term (current) drug therapy: Secondary | ICD-10-CM | POA: Diagnosis not present

## 2015-03-09 DIAGNOSIS — R112 Nausea with vomiting, unspecified: Secondary | ICD-10-CM | POA: Diagnosis not present

## 2015-03-09 DIAGNOSIS — R0602 Shortness of breath: Secondary | ICD-10-CM | POA: Diagnosis not present

## 2015-03-09 DIAGNOSIS — R079 Chest pain, unspecified: Secondary | ICD-10-CM | POA: Diagnosis not present

## 2015-03-09 DIAGNOSIS — R630 Anorexia: Secondary | ICD-10-CM | POA: Diagnosis not present

## 2015-03-09 DIAGNOSIS — R1013 Epigastric pain: Secondary | ICD-10-CM

## 2015-03-09 DIAGNOSIS — Z720 Tobacco use: Secondary | ICD-10-CM | POA: Diagnosis not present

## 2015-03-09 LAB — I-STAT TROPONIN, ED: Troponin i, poc: 0 ng/mL (ref 0.00–0.08)

## 2015-03-09 LAB — CBC WITH DIFFERENTIAL/PLATELET
Basophils Absolute: 0 10*3/uL (ref 0.0–0.1)
Basophils Relative: 0 % (ref 0–1)
Eosinophils Absolute: 0 10*3/uL (ref 0.0–0.7)
Eosinophils Relative: 0 % (ref 0–5)
HCT: 44 % (ref 39.0–52.0)
Hemoglobin: 14.8 g/dL (ref 13.0–17.0)
Lymphocytes Relative: 20 % (ref 12–46)
Lymphs Abs: 1.1 10*3/uL (ref 0.7–4.0)
MCH: 27.9 pg (ref 26.0–34.0)
MCHC: 33.6 g/dL (ref 30.0–36.0)
MCV: 83 fL (ref 78.0–100.0)
Monocytes Absolute: 0.3 10*3/uL (ref 0.1–1.0)
Monocytes Relative: 5 % (ref 3–12)
Neutro Abs: 4.4 10*3/uL (ref 1.7–7.7)
Neutrophils Relative %: 75 % (ref 43–77)
Platelets: 224 10*3/uL (ref 150–400)
RBC: 5.3 MIL/uL (ref 4.22–5.81)
RDW: 11.8 % (ref 11.5–15.5)
WBC: 5.8 10*3/uL (ref 4.0–10.5)

## 2015-03-09 LAB — COMPREHENSIVE METABOLIC PANEL
ALT: 10 U/L — ABNORMAL LOW (ref 17–63)
AST: 22 U/L (ref 15–41)
Albumin: 4.8 g/dL (ref 3.5–5.0)
Alkaline Phosphatase: 57 U/L (ref 38–126)
Anion gap: 9 (ref 5–15)
BUN: 11 mg/dL (ref 6–20)
CO2: 26 mmol/L (ref 22–32)
Calcium: 9.5 mg/dL (ref 8.9–10.3)
Chloride: 103 mmol/L (ref 101–111)
Creatinine, Ser: 1.36 mg/dL — ABNORMAL HIGH (ref 0.61–1.24)
GFR calc Af Amer: >60 mL/min (ref >60)
GFR calc non Af Amer: >60 mL/min (ref >60)
Glucose, Bld: 114 mg/dL — ABNORMAL HIGH (ref 70–99)
Potassium: 3.4 mmol/L — ABNORMAL LOW (ref 3.5–5.1)
Sodium: 138 mmol/L (ref 135–145)
Total Bilirubin: 0.9 mg/dL (ref 0.3–1.2)
Total Protein: 7.6 g/dL (ref 6.5–8.1)

## 2015-03-09 LAB — URINALYSIS, ROUTINE W REFLEX MICROSCOPIC
Bilirubin Urine: NEGATIVE
Glucose, UA: NEGATIVE mg/dL
Hgb urine dipstick: NEGATIVE
Ketones, ur: 15 mg/dL — AB
Leukocytes, UA: NEGATIVE
Nitrite: NEGATIVE
Protein, ur: NEGATIVE mg/dL
Specific Gravity, Urine: 1.027 (ref 1.005–1.030)
Urobilinogen, UA: 1 mg/dL (ref 0.0–1.0)
pH: 8 (ref 5.0–8.0)

## 2015-03-09 LAB — URINE MICROSCOPIC-ADD ON

## 2015-03-09 LAB — LIPASE, BLOOD: Lipase: 34 U/L (ref 22–51)

## 2015-03-09 MED ORDER — PROMETHAZINE HCL 25 MG/ML IJ SOLN
25.0000 mg | Freq: Once | INTRAMUSCULAR | Status: AC
  Administered 2015-03-09: 25 mg via INTRAVENOUS
  Filled 2015-03-09: qty 1

## 2015-03-09 MED ORDER — HYDROMORPHONE HCL 1 MG/ML IJ SOLN
1.0000 mg | Freq: Once | INTRAMUSCULAR | Status: AC
  Administered 2015-03-09: 1 mg via INTRAVENOUS
  Filled 2015-03-09: qty 1

## 2015-03-09 MED ORDER — DICYCLOMINE HCL 20 MG PO TABS
20.0000 mg | ORAL_TABLET | Freq: Two times a day (BID) | ORAL | Status: DC
Start: 2015-03-09 — End: 2015-10-25

## 2015-03-09 MED ORDER — FAMOTIDINE IN NACL 20-0.9 MG/50ML-% IV SOLN
20.0000 mg | Freq: Once | INTRAVENOUS | Status: AC
  Administered 2015-03-09: 20 mg via INTRAVENOUS
  Filled 2015-03-09: qty 50

## 2015-03-09 MED ORDER — PANTOPRAZOLE SODIUM 40 MG IV SOLR
40.0000 mg | Freq: Once | INTRAVENOUS | Status: AC
  Administered 2015-03-09: 40 mg via INTRAVENOUS
  Filled 2015-03-09: qty 40

## 2015-03-09 MED ORDER — IOHEXOL 300 MG/ML  SOLN
50.0000 mL | Freq: Once | INTRAMUSCULAR | Status: AC | PRN
Start: 2015-03-09 — End: 2015-03-09
  Administered 2015-03-09: 50 mL via ORAL

## 2015-03-09 MED ORDER — METOCLOPRAMIDE HCL 5 MG/ML IJ SOLN
10.0000 mg | Freq: Once | INTRAMUSCULAR | Status: AC
  Administered 2015-03-09: 10 mg via INTRAVENOUS
  Filled 2015-03-09: qty 2

## 2015-03-09 MED ORDER — PROMETHAZINE HCL 25 MG PO TABS: 25.0000 mg | ORAL_TABLET | Freq: Four times a day (QID) | ORAL | Status: DC | PRN

## 2015-03-09 MED ORDER — SODIUM CHLORIDE 0.9 % IV BOLUS (SEPSIS)
1000.0000 mL | Freq: Once | INTRAVENOUS | Status: AC
  Administered 2015-03-09: 1000 mL via INTRAVENOUS

## 2015-03-09 MED ORDER — IOHEXOL 300 MG/ML  SOLN
100.0000 mL | Freq: Once | INTRAMUSCULAR | Status: AC | PRN
  Administered 2015-03-09: 100 mL via INTRAVENOUS

## 2015-03-09 NOTE — ED Provider Notes (Signed)
CSN: [id number]     Arrival date & time 03/09/15  0048 History   First MD Initiated Contact with Patient 03/09/15 0135     Chief Complaint  Patient presents with  . Abdominal Pain    seen yesterday for same     (Consider location/radiation/quality/duration/timing/severity/associated sxs/prior Treatment) Patient is a 24 y.o. male presenting with abdominal pain.  Abdominal Pain Associated symptoms: chest pain, nausea, shortness of breath and vomiting   Associated symptoms: no chills, no constipation, no cough, no diarrhea, no fatigue and no fever   Pt is a 24yo male presenting to ED with c/o gradually worsening epigastric pain with associated nausea and vomiting.  Pt was seen on 03/07/15 for same, given IV fluids, Zofran and morphine.  Symptoms did improve, pt was able to keep down PO fluids and was advised to f/u with GI on Monday, 03/09/15.  Pt states he lost track of how many times he has vomiting today but does report vomiting at least 3 times since being in ED, then started to vomit during exam.  States he tried zofran at home twice w/o relief.  Epigastric pain is aching and burning, 10/10 at worst.  Pt denies fever, chills, or diarrhea.  Pt does report loss of appetite as well as SOB and chest pain, pt states due to severe abdominal pain.  No hx of abdominal surgeries.  Denies urinary symptoms. Denies hx of heart or lung disease.  Denies hx of recent alcohol consumption. Denies hx of pancreatitis.   History reviewed. No pertinent past medical history. History reviewed. No pertinent past surgical history. History reviewed. No pertinent family history. History  Substance Use Topics  . Smoking status: Current Every Day Smoker    Types: Cigars  . Smokeless tobacco: Not on file  . Alcohol Use: Yes     Comment: "light social drinking"    Review of Systems  Constitutional: Positive for appetite change. Negative for fever, chills, diaphoresis and fatigue.  Respiratory: Positive for shortness  of breath. Negative for cough.   Cardiovascular: Positive for chest pain. Negative for palpitations and leg swelling.  Gastrointestinal: Positive for nausea, vomiting and abdominal pain ( epigastric pain). Negative for diarrhea, constipation and blood in stool.  All other systems reviewed and are negative.     Allergies  Review of patient's allergies indicates no known allergies.  Home Medications   Prior to Admission medications   Medication Sig Start Date End Date Taking? Authorizing Provider  ondansetron (ZOFRAN) 4 MG tablet Take 1 tablet (4 mg total) by mouth every 6 (six) hours. 03/08/15  Yes Heather Laisure, PA-C  Ranitidine HCl (ACID REDUCER PO) Take 1 tablet by mouth once.   Yes Historical Provider, MD  simethicone (MYLICON) 80 MG chewable tablet Chew 80 mg by mouth every 6 (six) hours as needed for flatulence.   Yes Historical Provider, MD  dicyclomine (BENTYL) 20 MG tablet Take 1 tablet (20 mg total) by mouth 2 (two) times daily. 03/09/15   Junius Finner, PA-C  ondansetron (ZOFRAN ODT) 4 MG disintegrating tablet  ODT q4 hours prn nausea/vomit Patient not taking: Reported on 03/07/2015 05/22/14   Nada Boozer Hess, PA-C  pantoprazole (PROTONIX) 20 MG tablet Take 1 tablet (20 mg total) by mouth daily. Patient not taking: Reported on 03/07/2015 05/22/14   Kathrynn Speed, PA-C  promethazine (PHENERGAN) 25 MG tablet Take 1 tablet (25 mg total) by mouth every 6 (six) hours as needed for nausea or vomiting. 03/09/15   Junius Finner, PA-C  BP 114/73 mmHg  Pulse 59  Temp(Src) 97.8 F (36.6 C) (Oral)  Resp 15  SpO2 98% Physical Exam  Constitutional: He appears well-developed and well-nourished. He appears distressed.  Pt appears fatigued and acutely ill, actively vomiting during exam  HENT:  Head: Normocephalic and atraumatic.  Eyes: Conjunctivae are normal. No scleral icterus.  Neck: Normal range of motion. Neck supple.  Cardiovascular: Normal rate, regular rhythm and normal heart sounds.    Pulmonary/Chest: Breath sounds normal. He is in respiratory distress. He has no wheezes. He has no rales. He exhibits no tenderness.  Mild respiratory distress. Pt c/o SOB when sitting up for heart and lung exam.  No wheeze or rhonchi. No accessory muscle use.  Abdominal: Soft. Bowel sounds are normal. He exhibits no distension and no mass. There is tenderness. There is no rebound and no guarding.  Soft, diffuse tenderness, worse in epigastrium.   Musculoskeletal: Normal range of motion.  Neurological: He is alert.  Skin: Skin is warm and dry.  Nursing note and vitals reviewed.   ED Course  Procedures (including critical care time) Labs Review Labs Reviewed  COMPREHENSIVE METABOLIC PANEL - Abnormal; Notable for the following:    Potassium 3.4 (*)    Glucose, Bld 114 (*)    Creatinine, Ser 1.36 (*)    ALT 10 (*)    All other components within normal limits  URINALYSIS, ROUTINE W REFLEX MICROSCOPIC - Abnormal; Notable for the following:    APPearance TURBID (*)    Ketones, ur 15 (*)    All other components within normal limits  LIPASE, BLOOD  CBC WITH DIFFERENTIAL/PLATELET  URINE MICROSCOPIC-ADD ON  Rosezena Sensor, ED    Imaging Review Dg Chest 2 View  03/09/2015   CLINICAL DATA:  Abdominal pain and nausea since yesterday.  EXAM: CHEST  2 VIEW  COMPARISON:  None.  FINDINGS: Normal mediastinum and cardiac silhouette. Normal pulmonary vasculature. No evidence of effusion, infiltrate, or pneumothorax. No acute bony abnormality.  IMPRESSION: Normal chest radiograph.   Electronically Signed   By: Genevive Bi M.D.   On: 03/09/2015 02:20   Ct Abdomen Pelvis W Contrast  03/09/2015   CLINICAL DATA:  Generalized abdominal pain with nausea and vomiting. Seen here yesterday for same symptoms.  EXAM: CT ABDOMEN AND PELVIS WITH CONTRAST  TECHNIQUE: Multidetector CT imaging of the abdomen and pelvis was performed using the standard protocol following bolus administration of intravenous  contrast.  CONTRAST:  50mL OMNIPAQUE IOHEXOL 300 MG/ML SOLN, OMNIPAQUE IOHEXOL 300 MG/ML SOLN  COMPARISON:  CT of the abdomen and pelvis May 22, 2014  FINDINGS: LUNG BASES: Included view of the lung bases are clear. Visualized heart and pericardium are unremarkable.  SOLID ORGANS: The liver demonstrates subcentimeter granulomas in the RIGHT lobe, otherwise unremarkable. Spleen, gallbladder, pancreas and adrenal glands are unremarkable.  GASTROINTESTINAL TRACT: The stomach, small and large bowel are normal in course and caliber without inflammatory changes. Oral contrast in the stomach. Normal appendix.  KIDNEYS/ URINARY TRACT: Kidneys are orthotopic, demonstrating symmetric enhancement. 4 mm upper pole calculus. Early excretion of contrast limits assessment for small nonobstructing nephrolithiasis. No hydronephrosis or solid renal masses. The unopacified ureters are normal in course and caliber. Urinary bladder is partially distended and unremarkable.  PERITONEUM/RETROPERITONEUM: Aortoiliac vessels are normal in course and caliber. No lymphadenopathy by CT size criteria. Internal reproductive organs are unremarkable. Small amount of low-density ascites in the bilateral pericolic gutters extending into the pelvis. No abscess. No intraperitoneal free air.  SOFT TISSUE/OSSEOUS STRUCTURES: Non-suspicious.  IMPRESSION: Small amount of nonspecific ascites.  Normal appendix.  Nonobstructing 4 mm RIGHT upper pole nephrolithiasis.   Electronically Signed   By: Awilda Metroourtnay  Bloomer   On: 03/09/2015 04:01     EKG Interpretation   Date/Time:  Monday Mar 09 2015 02:20:58 EDT Ventricular Rate:  73 PR Interval:  145 QRS Duration: 97 QT Interval:  383 QTC Calculation: 422 R Axis:   59 Text Interpretation:  Sinus rhythm Nonspecific T abnormalities, lateral  leads Confirmed by OTTER  MD, OLGA ([id number]) on 03/09/2015 3:05:26 AM      MDM   Final diagnoses:  Epigastric pain  Non-intractable vomiting with nausea,  vomiting of unspecified type   Pt is a 24yo male presenting to ED with c/o epigastric pain with associated nausea and vomiting. Pt vomiting during exam. No blood in emesis.  Pt was seen for same in ED 2 days ago, advised to f/u with GI.  Pt states he could not wait due to vomiting last night that was unrelieved with zofran.  Abd: soft, diffuse abdominal pain, worse in epigastrium. Pt is afebrile.   Labs: unremarkable.  CT abd: unremarkable.   Pt given IV fluids, reglan, protonix, and pepcid in ED.  Pain did not improve. Pt given IV dilaudid, states pain has improved some.   4:06 Pt appears to be resting comfortably in exam bed, NAD  Abdomen soft, mild tenderness to epigastrium. Not concerned for emergent process taking place at this time.  Pt initially c/o SOB, however appears much more comfortable. Not concerned for ACS, PE, pneumothorax. Not concerned for surgical abdomen including perforated bowel.  Discussed pt with Dr. Norlene Campbelltter who also examined labs and imaging. Agrees with plan to have pt f/u with GI.  Rx: bentyl and phenergan. Home care instructions provided. Pt and mother verbalized understanding and agreement with tx plan.    4:44 AM at discharge when last set of vitals were being taken, pt stated he did not feel well again.  Will give a dose of phenergan.   Pt able to keep down ice chips and has not vomited up the contrast he consumed for CT scan. Pt stable for discharge home.    Junius FinnerErin O'Malley, PA-C 03/09/15 [id number]  Marisa Severinlga Otter, MD 03/09/15 [phone]

## 2015-03-09 NOTE — ED Notes (Signed)
Patient transported to CT 

## 2015-03-09 NOTE — ED Notes (Signed)
Gave urinal to pt.   Will check with pt in 15 minutes.

## 2015-03-09 NOTE — ED Notes (Signed)
Patient c/o abd pain with nausea and vomiting, was seen yesterday for same, referred to GI (unable to schedule appt due to weekend). Denies diarrhea. Patient states the pain is so severe it is making him feel SOB.

## 2015-03-10 ENCOUNTER — Encounter (HOSPITAL_COMMUNITY): Payer: Self-pay | Admitting: Emergency Medicine

## 2015-03-10 ENCOUNTER — Observation Stay (HOSPITAL_COMMUNITY)
Admission: EM | Admit: 2015-03-10 | Discharge: 2015-03-12 | Disposition: A | Discharge status: Home / Self Care | Payer: BLUE CROSS/BLUE SHIELD | Attending: Internal Medicine | Admitting: Internal Medicine

## 2015-03-10 ENCOUNTER — Emergency Department (HOSPITAL_COMMUNITY)
Admission: EM | Admit: 2015-03-10 | Discharge: 2015-03-10 | Discharge status: Against Medical Advice | Payer: BLUE CROSS/BLUE SHIELD | Source: Home / Self Care

## 2015-03-10 DIAGNOSIS — F1010 Alcohol abuse, uncomplicated: Secondary | ICD-10-CM | POA: Insufficient documentation

## 2015-03-10 DIAGNOSIS — F17210 Nicotine dependence, cigarettes, uncomplicated: Secondary | ICD-10-CM | POA: Diagnosis not present

## 2015-03-10 DIAGNOSIS — R1110 Vomiting, unspecified: Secondary | ICD-10-CM | POA: Diagnosis not present

## 2015-03-10 DIAGNOSIS — K219 Gastro-esophageal reflux disease without esophagitis: Secondary | ICD-10-CM | POA: Diagnosis not present

## 2015-03-10 DIAGNOSIS — R6510 Systemic inflammatory response syndrome (SIRS) of non-infectious origin without acute organ dysfunction: Secondary | ICD-10-CM | POA: Diagnosis present

## 2015-03-10 DIAGNOSIS — F1210 Cannabis abuse, uncomplicated: Secondary | ICD-10-CM | POA: Insufficient documentation

## 2015-03-10 DIAGNOSIS — Z720 Tobacco use: Secondary | ICD-10-CM

## 2015-03-10 DIAGNOSIS — R112 Nausea with vomiting, unspecified: Secondary | ICD-10-CM | POA: Diagnosis present

## 2015-03-10 DIAGNOSIS — Z716 Tobacco abuse counseling: Secondary | ICD-10-CM | POA: Insufficient documentation

## 2015-03-10 DIAGNOSIS — A419 Sepsis, unspecified organism: Secondary | ICD-10-CM

## 2015-03-10 DIAGNOSIS — N200 Calculus of kidney: Secondary | ICD-10-CM | POA: Diagnosis not present

## 2015-03-10 HISTORY — DX: Tobacco use: Z72.0

## 2015-03-10 LAB — HEPATIC FUNCTION PANEL
ALT: 10 U/L — AB (ref 17–63)
AST: 23 U/L (ref 15–41)
Albumin: 5.2 g/dL — ABNORMAL HIGH (ref 3.5–5.0)
Alkaline Phosphatase: 51 U/L (ref 38–126)
BILIRUBIN DIRECT: 0.2 mg/dL (ref 0.1–0.5)
Indirect Bilirubin: 1.6 mg/dL — ABNORMAL HIGH (ref 0.3–0.9)
Total Bilirubin: 1.8 mg/dL — ABNORMAL HIGH (ref 0.3–1.2)
Total Protein: 8.3 g/dL — ABNORMAL HIGH (ref 6.5–8.1)

## 2015-03-10 LAB — CBC WITH DIFFERENTIAL/PLATELET
BASOS ABS: 0 10*3/uL (ref 0.0–0.1)
BASOS PCT: 0 % (ref 0–1)
Eosinophils Absolute: 0 10*3/uL (ref 0.0–0.7)
Eosinophils Relative: 0 % (ref 0–5)
HCT: 44.5 % (ref 39.0–52.0)
Hemoglobin: 15 g/dL (ref 13.0–17.0)
LYMPHS PCT: 16 % (ref 12–46)
Lymphs Abs: 1.1 10*3/uL (ref 0.7–4.0)
MCH: 27.9 pg (ref 26.0–34.0)
MCHC: 33.7 g/dL (ref 30.0–36.0)
MCV: 82.9 fL (ref 78.0–100.0)
Monocytes Absolute: 0.8 10*3/uL (ref 0.1–1.0)
Monocytes Relative: 11 % (ref 3–12)
NEUTROS ABS: 5 10*3/uL (ref 1.7–7.7)
Neutrophils Relative %: 73 % (ref 43–77)
PLATELETS: 227 10*3/uL (ref 150–400)
RBC: 5.37 MIL/uL (ref 4.22–5.81)
RDW: 12.1 % (ref 11.5–15.5)
WBC: 6.9 10*3/uL (ref 4.0–10.5)

## 2015-03-10 LAB — I-STAT CHEM 8, ED
BUN: 13 mg/dL (ref 6–20)
CALCIUM ION: 1.21 mmol/L (ref 1.12–1.23)
CHLORIDE: 100 mmol/L — AB (ref 101–111)
CREATININE: 1 mg/dL (ref 0.61–1.24)
GLUCOSE: 115 mg/dL — AB (ref 70–99)
HCT: 50 % (ref 39.0–52.0)
Hemoglobin: 17 g/dL (ref 13.0–17.0)
Potassium: 3.7 mmol/L (ref 3.5–5.1)
Sodium: 139 mmol/L (ref 135–145)
TCO2: 24 mmol/L (ref 0–100)

## 2015-03-10 LAB — LIPASE, BLOOD: LIPASE: 86 U/L — AB (ref 22–51)

## 2015-03-10 MED ORDER — ONDANSETRON HCL 4 MG/2ML IJ SOLN
4.0000 mg | Freq: Three times a day (TID) | INTRAMUSCULAR | Status: AC | PRN
  Administered 2015-03-10 – 2015-03-11 (×2): 4 mg via INTRAVENOUS
  Filled 2015-03-10 (×2): qty 2

## 2015-03-10 MED ORDER — DICYCLOMINE HCL 10 MG/ML IM SOLN
20.0000 mg | Freq: Once | INTRAMUSCULAR | Status: AC
  Administered 2015-03-10: 20 mg via INTRAMUSCULAR
  Filled 2015-03-10: qty 2

## 2015-03-10 MED ORDER — ACETAMINOPHEN 325 MG PO TABS
650.0000 mg | ORAL_TABLET | Freq: Once | ORAL | Status: DC
Start: 2015-03-10 — End: 2015-03-10
  Filled 2015-03-10: qty 2

## 2015-03-10 MED ORDER — ONDANSETRON HCL 4 MG/2ML IJ SOLN
4.0000 mg | Freq: Once | INTRAMUSCULAR | Status: AC
  Administered 2015-03-10: 4 mg via INTRAVENOUS
  Filled 2015-03-10: qty 2

## 2015-03-10 MED ORDER — ACETAMINOPHEN 325 MG PO TABS: 650.0000 mg | ORAL_TABLET | Freq: Four times a day (QID) | ORAL | Status: DC | PRN

## 2015-03-10 MED ORDER — SODIUM CHLORIDE 0.9 % IV BOLUS (SEPSIS)
1000.0000 mL | Freq: Once | INTRAVENOUS | Status: AC
  Administered 2015-03-10: 1000 mL via INTRAVENOUS

## 2015-03-10 MED ORDER — SODIUM CHLORIDE 0.9 % IV SOLN
INTRAVENOUS | Status: DC
  Administered 2015-03-11: 01:00:00 via INTRAVENOUS

## 2015-03-10 MED ORDER — HYDROMORPHONE HCL 1 MG/ML IJ SOLN
1.0000 mg | INTRAMUSCULAR | Status: AC | PRN
  Administered 2015-03-10 – 2015-03-11 (×2): 1 mg via INTRAVENOUS
  Filled 2015-03-10 (×2): qty 1

## 2015-03-10 MED ORDER — SODIUM CHLORIDE 0.9 % IV BOLUS (SEPSIS)
2000.0000 mL | Freq: Once | INTRAVENOUS | Status: AC
Start: 2015-03-10 — End: 2015-03-11
  Administered 2015-03-10: 2000 mL via INTRAVENOUS

## 2015-03-10 MED ORDER — GI COCKTAIL ~~LOC~~
30.0000 mL | Freq: Once | ORAL | Status: AC
  Administered 2015-03-10: 30 mL via ORAL
  Filled 2015-03-10: qty 30

## 2015-03-10 MED ORDER — ACETAMINOPHEN 650 MG RE SUPP
650.0000 mg | Freq: Four times a day (QID) | RECTAL | Status: DC | PRN
  Filled 2015-03-10: qty 1

## 2015-03-10 NOTE — ED Notes (Addendum)
Pt reports constant emesis 5 or 6 times a day since last Thursday. Pt seen by gastro MD and was told if he continues to get worse to come to hospital for admission. Pt states he is weak and unable to keep medications down.

## 2015-03-10 NOTE — H&P (Signed)
[facility] History and Physical  Garrett IslamFred Claflin ZOX:[id number] DOB: Apr 10, 1991 DOA: 03/10/2015  Referring physician: ED physician PCP: No primary care provider on file.  Specialists:   Chief Complaint: Nausea, vomiting, abdominal pain  HPI: Garrett IslamFred Martinez is a 24 y.o. male with past medical history of tobacco abuse, who presents with nausea, vomiting and abdominal pain.  Patient states for the past 6 days he has had severe nausea and vomiting. He also has moderate abdominal pain. He has had 5-6 bouts of nonbloody nonbilious vomit per day. Report feeling weak, cannot keep any medication down. He did not have bowel movement for 3 days. He has been seen in the ED twice with this, last visit was last night. He had CT abdomen/pelvis yesterday, which showed nonobstructive 4 mm right upper pole kidney stone and small ascites otherwise negative.  He was seen by a gastroenterologist today (he cannot remember the doctor's name), and was told that his symptoms are likely related to the acid reflux. He was given prescription for Nexium. He could not keep this medication down, therefore no any improvement. Currently patient has fever, chills and fatigue. No running nose, ear pain, headaches, cough, chest pain, SOB, dysuria, urgency, frequency, hematuria, skin rashes, joint pain or leg swelling. No unilateral weakness, numbness or tingling sensations. No vision change or hearing loss.   In ED, patient was found to have WBC 6.9, temperature 100.4, tachycardia, oxygen saturation normal, electrolytes okay, Lipase 86, direct bilirubin 0.2, indirect bilirubin 1.6. Normal AST/ALT/ALP, negative urinalysis. Patient is admitted to inpatient for further evaluation and treatment. Patient is admitted to inpatient for further evaluation and treatment  Review of Systems:   Constitutional: has fever, chills; fatigue.  HEENT: No blurry vision, no diplopia, no pharyngitis, no dysphagia  CV: No chest pain, no palpitations, no  PND, no orthopnea, no edema. Resp: No SOB, no cough, no pleuritic pain. GI: has nausea, vomiting and abdominal pain, No diarrhea, no melena, no hematochezia.  GU: No dysuria, no hematuria, no frequency, no urgency.  MSK: no myalgias, no arthralgias.  Neuro: No headache, no focal neurological deficits, no history of seizures. Psych: No suicidal or homicidal idiation Endo: No heat intolerance, no cold intolerance, no polyuria, no polydipsia  Skin: No rashes, no skin lesions.  Heme: No easy bruising.  Travel history:No recent long distant travel.   Where does patient live?  At home Can patient participate in ADLs? Yes  Allergy: No Known Allergies  Past Medical History  Diagnosis Date  . Tobacco abuse     History reviewed. No pertinent past surgical history.  Social History:  reports that he has been smoking Cigars.  He does not have any smokeless tobacco history on file. He reports that he drinks alcohol. He reports that he uses illicit drugs (Marijuana).  Family History:  Family History  Problem Relation Age of Onset  . Lumbar disc disease Mother     Chronic low back pain     Prior to Admission medications   Medication Sig Start Date End Date Taking? Authorizing Provider  ondansetron (ZOFRAN) 4 MG tablet Take 1 tablet (4 mg total) by mouth every 6 (six) hours. 03/08/15  Yes Heather Laisure, PA-C  promethazine (PHENERGAN) 25 MG tablet Take 1 tablet (25 mg total) by mouth every 6 (six) hours as needed for nausea or vomiting. 03/09/15  Yes Junius FinnerErin O'Malley, PA-C  simethicone (MYLICON) 80 MG chewable tablet Chew 80 mg by mouth every 6 (six) hours as needed for flatulence.   Yes  Historical Provider, MD  dicyclomine (BENTYL) 20 MG tablet Take 1 tablet (20 mg total) by mouth 2 (two) times daily. 03/09/15   Junius Finner, PA-C  ondansetron (ZOFRAN ODT) 4 MG disintegrating tablet  ODT q4 hours prn nausea/vomit Patient not taking: Reported on 03/07/2015 05/22/14   Nada Boozer Hess, PA-C   pantoprazole (PROTONIX) 20 MG tablet Take 1 tablet (20 mg total) by mouth daily. Patient not taking: Reported on 03/07/2015 05/22/14   Kathrynn Speed, PA-C    Physical Exam: Filed Vitals:   03/10/15 1944 03/10/15 2142  BP: 128/84 114/71  Pulse: 67 65  Temp: 99.9 F (37.7 C) 100.4 F (38 C)  TempSrc: Oral Oral  Resp: 20 18  Weight: 58.968 kg (130 lb)   SpO2: 100% 97%   General: Not in acute distress HEENT:       Eyes: PERRL, EOMI, no scleral icterus       ENT: No discharge from the ears and nose, no pharynx injection, no tonsillar enlargement.        Neck: No JVD, no bruit, no mass felt. Heme: No neck or axillary lymph node enlargement Cardiac: S1/S2, RRR, No murmurs, No gallops or rubs Pulm: Good air movement bilaterally. Clear to auscultation bilaterally. No rales, wheezing, rhonchi or rubs. Abd: No organomegaly, BS present. Mild diffuse abdominal tenderness without guarding or rebound tenderness. Negative Murphy sign, no pain at McBurney's point, no peritoneal sign..  Ext: No edema bilaterally. 2+DP/PT pulse bilaterally Musculoskeletal: No joint deformities, erythema, or stiffness, ROM full Skin: No rashes.  Neuro: Alert and oriented X3, cranial nerves II-XII grossly intact, muscle strength 5/5 in all extremeties, sensation to light touch intact.  Psych: Patient is not psychotic, no suicidal or hemocidal ideation.  Labs on Admission:  Basic Metabolic Panel:  Recent Labs Lab 03/07/15 2258 03/09/15 0211 03/10/15 2056  NA 138 138 139  K 4.1 3.4* 3.7  CL 104 103 100*  CO2 28 26  --   GLUCOSE 105* 114* 115*  BUN CREATININE 1.18 1.36* 1.00  CALCIUM 9.5 9.5  --    Liver Function Tests:  Recent Labs Lab 03/07/15 2258 03/09/15 0211 03/10/15 2049  AST ALT 10 10* 10*  ALKPHOS 60 57 51  BILITOT 1.0 0.9 1.8*  PROT 7.8 7.6 8.3*  ALBUMIN 5.0 4.8 5.2*    Recent Labs Lab 03/07/15 2258 03/09/15 0211 03/10/15 2049  LIPASE 34 34 86*   No results  for input(s): AMMONIA in the last 168 hours. CBC:  Recent Labs Lab 03/07/15 2258 03/09/15 0211 03/10/15 2049 03/10/15 2056  WBC 4.9 5.8 6.9  --   NEUTROABS 3.5 4.4 5.0  --   HGB 14.7 14.8 15.0 17.0  HCT 44.5 44.0 44.5 50.0  MCV 83.6 83.0 82.9  --   PLT 217 224 227  --    Cardiac Enzymes: No results for input(s): CKTOTAL, CKMB, CKMBINDEX, TROPONINI in the last 168 hours.  BNP (last 3 results) No results for input(s): BNP in the last 8760 hours.  ProBNP (last 3 results) No results for input(s): PROBNP in the last 8760 hours.  CBG: No results for input(s): GLUCAP in the last 168 hours.  Radiological Exams on Admission: Dg Chest 2 View  03/09/2015   CLINICAL DATA:  Abdominal pain and nausea since yesterday.  EXAM: CHEST  2 VIEW  COMPARISON:  None.  FINDINGS: Normal mediastinum and cardiac silhouette. Normal pulmonary vasculature. No evidence of effusion, infiltrate, or pneumothorax.  No acute bony abnormality.  IMPRESSION: Normal chest radiograph.   Electronically Signed   By: Genevive Bi M.D.   On: 03/09/2015 02:20   Ct Abdomen Pelvis W Contrast  03/09/2015   CLINICAL DATA:  Generalized abdominal pain with nausea and vomiting. Seen here yesterday for same symptoms.  EXAM: CT ABDOMEN AND PELVIS WITH CONTRAST  TECHNIQUE: Multidetector CT imaging of the abdomen and pelvis was performed using the standard protocol following bolus administration of intravenous contrast.  CONTRAST:  50mL OMNIPAQUE IOHEXOL 300 MG/ML SOLN, OMNIPAQUE IOHEXOL 300 MG/ML SOLN  COMPARISON:  CT of the abdomen and pelvis May 22, 2014  FINDINGS: LUNG BASES: Included view of the lung bases are clear. Visualized heart and pericardium are unremarkable.  SOLID ORGANS: The liver demonstrates subcentimeter granulomas in the RIGHT lobe, otherwise unremarkable. Spleen, gallbladder, pancreas and adrenal glands are unremarkable.  GASTROINTESTINAL TRACT: The stomach, small and large bowel are normal in course and caliber  without inflammatory changes. Oral contrast in the stomach. Normal appendix.  KIDNEYS/ URINARY TRACT: Kidneys are orthotopic, demonstrating symmetric enhancement. 4 mm upper pole calculus. Early excretion of contrast limits assessment for small nonobstructing nephrolithiasis. No hydronephrosis or solid renal masses. The unopacified ureters are normal in course and caliber. Urinary bladder is partially distended and unremarkable.  PERITONEUM/RETROPERITONEUM: Aortoiliac vessels are normal in course and caliber. No lymphadenopathy by CT size criteria. Internal reproductive organs are unremarkable. Small amount of low-density ascites in the bilateral pericolic gutters extending into the pelvis. No abscess. No intraperitoneal free air.  SOFT TISSUE/OSSEOUS STRUCTURES: Non-suspicious.  IMPRESSION: Small amount of nonspecific ascites.  Normal appendix.  Nonobstructing 4 mm RIGHT upper pole nephrolithiasis.   Electronically Signed   By: Awilda Metro   On: 03/09/2015 04:01    EKG: Independently reviewed.  No significant abnormalities.  Assessment/Plan Principal Problem:   Nausea & vomiting Active Problems:   SIRS (systemic inflammatory response syndrome)   Intractable vomiting with nausea   Tobacco abuse   Nausea and vomiting  Nausea, vomiting, abdominal pain: Etiology is not clear. Likely due to viral gastroenteritis. He had negative CT abdomen/pelvis yesterday. Gastroenterologist thought that his symptoms may be related to possible acid reflux.  lipase is slightly elevated at 86. He denies drinking alcohol. -Admit to med-surg bed for observation -prn Zofran for nausea, Dilaudid for abdominal pain -IV protonix -check UDS, alcohol level, HIV antibody -Check hepatitis panel given increased bilirubin -follow up X-ray of Chest/abd  SIRS: Currently patient has SIRS with fever and tachycardia. Patient is hemodynamically stable. -will get Procalcitonin and trend lactic acid level per sepsis  protocol -IVF: received 3L of NS bolus in ED, followed by 75 cc/h  Tobacco abuse and Alcohol abuse: -Did counseling about importance of quitting smoking -Nicotine patch  DVT ppx: SQ Heparin    Code Status: Full code Family Communication:   Yes, patient's  girlfriend     at bed side Disposition Plan: Admit to inpatient   Date of Service 03/10/2015    Lorretta Harp [facility] Pager [phone]  If 7PM-7AM, please contact night-coverage [email or web address] Password [email or web address] 03/10/2015, 10:59 PM

## 2015-03-10 NOTE — ED Provider Notes (Signed)
CSN: [id number]     Arrival date & time 03/10/15  1934 History   First MD Initiated Contact with Patient 03/10/15 2020     Chief Complaint  Patient presents with  . Emesis     (Consider location/radiation/quality/duration/timing/severity/associated sxs/prior Treatment) HPI   24 year old male presents for evaluation of persistent vomiting. Patient states for the past 6 days he has had persistent crampy abdominal pain with associate nausea and vomiting. He has had 5-6 bouts of nonbloody nonbilious vomit per day which has not improved. Complaining of crampy abdominal pain only with vomiting. Report feeling weak, cannot keep any medication down. He has been seen in the ED twice with this, last visit was last night. He has a normal abdominal CT scan. He was seen by a gastroenterologist today and was told that if his symptoms worsen he should come to the ER to be admitted. Patient states symptom has not improved and he continues to vomit. He denies any URI symptoms, denies any chest pain, shortness of breath, productive cough. Denies any dysuria. No recent travel, no recent sick contact.  .  History reviewed. No pertinent past medical history. History reviewed. No pertinent past surgical history. History reviewed. No pertinent family history. History  Substance Use Topics  . Smoking status: Current Every Day Smoker    Types: Cigars  . Smokeless tobacco: Not on file  . Alcohol Use: Yes     Comment: "light social drinking"    Review of Systems  All other systems reviewed and are negative.     Allergies  Review of patient's allergies indicates no known allergies.  Home Medications   Prior to Admission medications   Medication Sig Start Date End Date Taking? Authorizing Provider  dicyclomine (BENTYL) 20 MG tablet Take 1 tablet (20 mg total) by mouth 2 (two) times daily. 03/09/15   Junius Finner, PA-C  ondansetron (ZOFRAN ODT) 4 MG disintegrating tablet  ODT q4 hours prn  nausea/vomit Patient not taking: Reported on 03/07/2015 05/22/14   Nada Boozer Hess, PA-C  ondansetron (ZOFRAN) 4 MG tablet Take 1 tablet (4 mg total) by mouth every 6 (six) hours. 03/08/15   Heather Laisure, PA-C  pantoprazole (PROTONIX) 20 MG tablet Take 1 tablet (20 mg total) by mouth daily. Patient not taking: Reported on 03/07/2015 05/22/14   Kathrynn Speed, PA-C  promethazine (PHENERGAN) 25 MG tablet Take 1 tablet (25 mg total) by mouth every 6 (six) hours as needed for nausea or vomiting. 03/09/15   Junius Finner, PA-C  Ranitidine HCl (ACID REDUCER PO) Take 1 tablet by mouth once.    Historical Provider, MD  simethicone (MYLICON) 80 MG chewable tablet Chew 80 mg by mouth every 6 (six) hours as needed for flatulence.    Historical Provider, MD   BP 128/84 mmHg  Pulse 67  Temp(Src) 99.9 F (37.7 C) (Oral)  Resp 20  Wt 130 lb (58.968 kg)  SpO2 100% Physical Exam  Constitutional: He is oriented to person, place, and time. He appears well-developed and well-nourished. No distress.  Well-appearing African-American male resting, nontoxic appearance  HENT:  Head: Atraumatic.  Mouth/Throat: Oropharynx is clear and moist.  Eyes: Conjunctivae are normal.  Neck: Neck supple.  Cardiovascular: Normal rate and regular rhythm.   Pulmonary/Chest: Effort normal and breath sounds normal. No respiratory distress.  Abdominal: Soft. Bowel sounds are normal. He exhibits no distension. There is tenderness (Mild diffuse abdominal tenderness without guarding or rebound tenderness. Negative Murphy sign, no pain at McBurney's point, no peritoneal  sign.).  Musculoskeletal: He exhibits no edema.  Neurological: He is alert and oriented to person, place, and time.  Skin: No rash noted.  Psychiatric: He has a normal mood and affect.  Nursing note and vitals reviewed.   ED Course  Procedures (including critical care time)  9:14 PM Patient here with persistent intractable vomiting. Recent abdominal CT scan without any  acute abnormalities. Given his recurrent ER visit, plan to treat symptoms and will have patient admitted for further management.  10:06 PM Patient with low-grade temp of 100.4.  He has not had a bowel movement in the past 3 days but is able to pass flatus and no prior history of abdominal surgery. His labs are remarkable for elevated total bili of 1.8 and mildly elevated lipase of 86. Elevated albumin of 5.2. No history of diabetes, or heavy alcohol abuse.  Suspect viral GI as the cause.  Plan for admission for further management.    10:26 PM Appreciate consultation from [facility], DR. Niu who agrees to admit patient to medical surgical bed. Holding orders placed. Patient admitted for intractable nausea and vomiting.  Labs Review Labs Reviewed  HEPATIC FUNCTION PANEL - Abnormal; Notable for the following:    Total Protein 8.3 (*)    Albumin 5.2 (*)    ALT 10 (*)    Total Bilirubin 1.8 (*)    Indirect Bilirubin 1.6 (*)    All other components within normal limits  LIPASE, BLOOD - Abnormal; Notable for the following:    Lipase 86 (*)    All other components within normal limits  I-STAT CHEM 8, ED - Abnormal; Notable for the following:    Chloride 100 (*)    Glucose, Bld 115 (*)    All other components within normal limits  CBC WITH DIFFERENTIAL/PLATELET  URINALYSIS, ROUTINE W REFLEX MICROSCOPIC    Imaging Review Dg Chest 2 View  03/09/2015   CLINICAL DATA:  Abdominal pain and nausea since yesterday.  EXAM: CHEST  2 VIEW  COMPARISON:  None.  FINDINGS: Normal mediastinum and cardiac silhouette. Normal pulmonary vasculature. No evidence of effusion, infiltrate, or pneumothorax. No acute bony abnormality.  IMPRESSION: Normal chest radiograph.   Electronically Signed   By: Genevive BiStewart  Edmunds M.D.   On: 03/09/2015 02:20   Ct Abdomen Pelvis W Contrast  03/09/2015   CLINICAL DATA:  Generalized abdominal pain with nausea and vomiting. Seen here yesterday for same symptoms.  EXAM: CT  ABDOMEN AND PELVIS WITH CONTRAST  TECHNIQUE: Multidetector CT imaging of the abdomen and pelvis was performed using the standard protocol following bolus administration of intravenous contrast.  CONTRAST:  50mL OMNIPAQUE IOHEXOL 300 MG/ML SOLN, 100mL OMNIPAQUE IOHEXOL 300 MG/ML SOLN  COMPARISON:  CT of the abdomen and pelvis May 22, 2014  FINDINGS: LUNG BASES: Included view of the lung bases are clear. Visualized heart and pericardium are unremarkable.  SOLID ORGANS: The liver demonstrates subcentimeter granulomas in the RIGHT lobe, otherwise unremarkable. Spleen, gallbladder, pancreas and adrenal glands are unremarkable.  GASTROINTESTINAL TRACT: The stomach, small and large bowel are normal in course and caliber without inflammatory changes. Oral contrast in the stomach. Normal appendix.  KIDNEYS/ URINARY TRACT: Kidneys are orthotopic, demonstrating symmetric enhancement. 4 mm upper pole calculus. Early excretion of contrast limits assessment for small nonobstructing nephrolithiasis. No hydronephrosis or solid renal masses. The unopacified ureters are normal in course and caliber. Urinary bladder is partially distended and unremarkable.  PERITONEUM/RETROPERITONEUM: Aortoiliac vessels are normal in course and caliber. No lymphadenopathy by  CT size criteria. Internal reproductive organs are unremarkable. Small amount of low-density ascites in the bilateral pericolic gutters extending into the pelvis. No abscess. No intraperitoneal free air.  SOFT TISSUE/OSSEOUS STRUCTURES: Non-suspicious.  IMPRESSION: Small amount of nonspecific ascites.  Normal appendix.  Nonobstructing 4 mm RIGHT upper pole nephrolithiasis.   Electronically Signed   By: Awilda Metro   On: 03/09/2015 04:01     EKG Interpretation None      MDM   Final diagnoses:  Intractable vomiting with nausea, vomiting of unspecified type    BP 114/71 mmHg  Pulse 65  Temp(Src) 100.4 F (38 C) (Oral)  Resp 18  Wt 130 lb (58.968 kg)  SpO2  97%  I have reviewed nursing notes and vital signs. I personally reviewed the imaging tests through PACS system  I reviewed available ER/hospitalization records thought the EMR     Fayrene Helper, PA-C 03/11/15 0101  Linwood Dibbles, MD 03/16/15 [phone]

## 2015-03-11 ENCOUNTER — Observation Stay (HOSPITAL_COMMUNITY): Payer: BLUE CROSS/BLUE SHIELD

## 2015-03-11 ENCOUNTER — Encounter (HOSPITAL_COMMUNITY): Payer: Self-pay | Admitting: *Deleted

## 2015-03-11 DIAGNOSIS — R1013 Epigastric pain: Secondary | ICD-10-CM | POA: Diagnosis not present

## 2015-03-11 DIAGNOSIS — R112 Nausea with vomiting, unspecified: Secondary | ICD-10-CM | POA: Diagnosis not present

## 2015-03-11 DIAGNOSIS — N200 Calculus of kidney: Secondary | ICD-10-CM | POA: Diagnosis not present

## 2015-03-11 LAB — PROTIME-INR
INR: 1.15 (ref 0.00–1.49)
Prothrombin Time: 14.8 seconds (ref 11.6–15.2)

## 2015-03-11 LAB — LACTIC ACID, PLASMA
Lactic Acid, Venous: 1.9 mmol/L (ref 0.5–2.0)
Lactic Acid, Venous: 1.1 mmol/L (ref 0.5–2.0)

## 2015-03-11 LAB — COMPREHENSIVE METABOLIC PANEL
ALT: 11 U/L — ABNORMAL LOW (ref 17–63)
AST: 25 U/L (ref 15–41)
Albumin: 4.2 g/dL (ref 3.5–5.0)
Alkaline Phosphatase: 43 U/L (ref 38–126)
Anion gap: 5 (ref 5–15)
BUN: 12 mg/dL (ref 6–20)
CO2: 29 mmol/L (ref 22–32)
Calcium: 8.9 mg/dL (ref 8.9–10.3)
Chloride: 106 mmol/L (ref 101–111)
Creatinine, Ser: 0.95 mg/dL (ref 0.61–1.24)
GFR calc Af Amer: >60 mL/min (ref >60)
GFR calc non Af Amer: >60 mL/min (ref >60)
Glucose, Bld: 108 mg/dL — ABNORMAL HIGH (ref 70–99)
Potassium: 3.9 mmol/L (ref 3.5–5.1)
Sodium: 140 mmol/L (ref 135–145)
Total Bilirubin: 1.7 mg/dL — ABNORMAL HIGH (ref 0.3–1.2)
Total Protein: 6.7 g/dL (ref 6.5–8.1)

## 2015-03-11 LAB — URINALYSIS, ROUTINE W REFLEX MICROSCOPIC
BILIRUBIN URINE: NEGATIVE
Glucose, UA: NEGATIVE mg/dL
HGB URINE DIPSTICK: NEGATIVE
Ketones, ur: NEGATIVE mg/dL
Leukocytes, UA: NEGATIVE
Nitrite: NEGATIVE
PH: 7.5 (ref 5.0–8.0)
Protein, ur: NEGATIVE mg/dL
Specific Gravity, Urine: 1.024 (ref 1.005–1.030)
Urobilinogen, UA: 1 mg/dL (ref 0.0–1.0)

## 2015-03-11 LAB — HEPATITIS PANEL, ACUTE
HCV Ab: NEGATIVE
Hep A IgM: NONREACTIVE
Hep B C IgM: NONREACTIVE
Hepatitis B Surface Ag: NEGATIVE

## 2015-03-11 LAB — CBC
HCT: 41.2 % (ref 39.0–52.0)
Hemoglobin: 13.3 g/dL (ref 13.0–17.0)
MCH: 27.1 pg (ref 26.0–34.0)
MCHC: 32.3 g/dL (ref 30.0–36.0)
MCV: 84.1 fL (ref 78.0–100.0)
Platelets: 196 10*3/uL (ref 150–400)
RBC: 4.9 MIL/uL (ref 4.22–5.81)
RDW: 12 % (ref 11.5–15.5)
WBC: 6.1 10*3/uL (ref 4.0–10.5)

## 2015-03-11 LAB — ETHANOL: Alcohol, Ethyl (B): <5 mg/dL (ref <5)

## 2015-03-11 LAB — APTT: aPTT: 29 seconds (ref 24–37)

## 2015-03-11 LAB — RAPID URINE DRUG SCREEN, HOSP PERFORMED
AMPHETAMINES: NOT DETECTED
BARBITURATES: POSITIVE — AB
Benzodiazepines: NOT DETECTED
COCAINE: NOT DETECTED
Opiates: POSITIVE — AB
TETRAHYDROCANNABINOL: POSITIVE — AB

## 2015-03-11 LAB — GLUCOSE, CAPILLARY: GLUCOSE-CAPILLARY: 97 mg/dL (ref 70–99)

## 2015-03-11 LAB — HIV ANTIBODY (ROUTINE TESTING W REFLEX): HIV Screen 4th Generation wRfx: NONREACTIVE

## 2015-03-11 LAB — PROCALCITONIN: Procalcitonin: <0.1 ng/mL

## 2015-03-11 MED ORDER — HEPARIN SODIUM (PORCINE) 5000 UNIT/ML IJ SOLN
5000.0000 [IU] | Freq: Three times a day (TID) | INTRAMUSCULAR | Status: DC
  Administered 2015-03-11: 5000 [IU] via SUBCUTANEOUS
  Filled 2015-03-11 (×8): qty 1

## 2015-03-11 MED ORDER — NICOTINE 14 MG/24HR TD PT24
14.0000 mg | MEDICATED_PATCH | Freq: Every day | TRANSDERMAL | Status: DC
  Administered 2015-03-11 – 2015-03-12 (×2): 14 mg via TRANSDERMAL
  Filled 2015-03-11 (×2): qty 1

## 2015-03-11 MED ORDER — PANTOPRAZOLE SODIUM 40 MG IV SOLR
40.0000 mg | INTRAVENOUS | Status: DC
  Administered 2015-03-11 (×2): 40 mg via INTRAVENOUS
  Filled 2015-03-11 (×3): qty 40

## 2015-03-11 MED ORDER — ONDANSETRON HCL 4 MG/2ML IJ SOLN
4.0000 mg | Freq: Once | INTRAMUSCULAR | Status: AC
  Administered 2015-03-11: 4 mg via INTRAVENOUS

## 2015-03-11 MED ORDER — KETOROLAC TROMETHAMINE 15 MG/ML IJ SOLN
15.0000 mg | Freq: Four times a day (QID) | INTRAMUSCULAR | Status: DC | PRN
Start: 2015-03-11 — End: 2015-03-12
  Administered 2015-03-11: 15 mg via INTRAVENOUS
  Filled 2015-03-11: qty 1

## 2015-03-11 MED ORDER — ONDANSETRON HCL 4 MG/2ML IJ SOLN
4.0000 mg | Freq: Four times a day (QID) | INTRAMUSCULAR | Status: DC | PRN
  Administered 2015-03-11: 4 mg via INTRAVENOUS
  Filled 2015-03-11 (×2): qty 2

## 2015-03-11 NOTE — Progress Notes (Signed)
UR completed 

## 2015-03-11 NOTE — ED Notes (Signed)
Attempted to call report but primary nurse is unable to take report. Primary nurse is suppose to call back.

## 2015-03-11 NOTE — Progress Notes (Signed)
[facility] PROGRESS NOTE  Garrett IslamFred Martinez RUE:[id number] DOB: 1991/03/13 DOA: 03/10/2015 PCP: No primary care provider on file.  Assessment/Plan: 24 y/o male with no significant medical history presented with nausea, vomiting, abdominal pains for several days. He was recently seen in ED multiple times.  -admitted with abdominal pain, vomiting   1. Nausea, vomiting, abdominal pain of unclear etiology. Likely viral gastroenteritis. abd exam is unremarkable. CT abd: Nonobstructing 4 mm RIGHT upper pole nephrolithiasis -symptoms are improving on supportive care. Pain control, antiemetics. Will monitor 24 hrs  2. Nonobstructing 4 mm RIGHT upper pole nephrolithiasis. Patient denies back pains. Recent UA was unremarkable. Will recheck UA. Cont IVF.    Code Status: full Family Communication: d/w patient (indicate person spoken with, relationship, and if by phone, the number) Disposition Plan: home 24-48 hrs    Consultants:  none  Procedures:  none  Antibiotics:  none (indicate start date, and stop date if known)  HPI/Subjective: Alert, oriented   Objective: Filed Vitals:   03/11/15 0503  BP: 127/87  Pulse: 60  Temp: 99.1 F (37.3 C)  Resp: 18   No intake or output data in the 24 hours ending 03/11/15 1026 Filed Weights   03/10/15 1944 03/11/15 0045  Weight: 58.968 kg (130 lb) 60.5 kg (133 lb 6.1 oz)    Exam:   General:  No distress. No chest pain   Cardiovascular: s1,s2 rrr  Respiratory: CTA BL  Abdomen: soft, nt,nd   Musculoskeletal: no Leg edema   Data Reviewed: Basic Metabolic Panel:  Recent Labs Lab 03/07/15 2258 03/09/15 0211 03/10/15 2056 03/11/15 0556  NA 138 138 139 140  K 4.1 3.4* 3.7 3.9  CL 104 103 100* 106  CO2 28 26  --  29  GLUCOSE 105* 114* 115* 108*  BUN 11 11 13 12   CREATININE 1.18 1.36* 1.00 0.95  CALCIUM 9.5 9.5  --  8.9   Liver Function Tests:  Recent Labs Lab 03/07/15 2258 03/09/15 0211 03/10/15 2049 03/11/15 0556   AST 24 22 23 25   ALT 10 10* 10* 11*  ALKPHOS 60 57 51 43  BILITOT 1.0 0.9 1.8* 1.7*  PROT 7.8 7.6 8.3* 6.7  ALBUMIN 5.0 4.8 5.2* 4.2    Recent Labs Lab 03/07/15 2258 03/09/15 0211 03/10/15 2049  LIPASE 34 34 86*   No results for input(s): AMMONIA in the last 168 hours. CBC:  Recent Labs Lab 03/07/15 2258 03/09/15 0211 03/10/15 2049 03/10/15 2056 03/11/15 0556  WBC 4.9 5.8 6.9  --  6.1  NEUTROABS 3.5 4.4 5.0  --   --   HGB 14.7 14.8 15.0 17.0 13.3  HCT 44.5 44.0 44.5 50.0 41.2  MCV 83.6 83.0 82.9  --  84.1  PLT 217 224 227  --  196   Cardiac Enzymes: No results for input(s): CKTOTAL, CKMB, CKMBINDEX, TROPONINI in the last 168 hours. BNP (last 3 results) No results for input(s): BNP in the last 8760 hours.  ProBNP (last 3 results) No results for input(s): PROBNP in the last 8760 hours.  CBG:  Recent Labs Lab 03/11/15 0728  GLUCAP 97    No results found for this or any previous visit (from the past 240 hour(s)).   Studies: Dg Abd Acute W/chest  03/11/2015   CLINICAL DATA:  24 year old male with mid abdominal pain and nausea with vomiting since this morning. A cups. Initial encounter.  EXAM: DG ABDOMEN ACUTE W/ 1V CHEST  COMPARISON:  CT Abdomen and Pelvis 03/09/2015 and earlier.  FINDINGS: Lung volumes remain normal. Normal cardiac size and mediastinal contours. Visualized tracheal air column is within normal limits. The lungs remain clear. No pneumothorax or pneumoperitoneum.  Stable right nephrolithiasis. Normal bowel gas pattern with some retained oral contrast in the colon. Poor delineation of abdominal and pelvic visceral contours, with a paucity of intra-abdominal fat noted on the recent CT. No osseous abnormality identified.  IMPRESSION: 1.  Normal bowel gas pattern, no free air. 2.  Negative chest, no acute cardiopulmonary abnormality. 3. Stable right nephrolithiasis.   Electronically Signed   By: Odessa FlemingH  Hall M.D.   On: 03/11/2015 09:06    Scheduled Meds: .  heparin  5,000 Units Subcutaneous 3 times per day  . nicotine  14 mg Transdermal Daily  . pantoprazole (PROTONIX) IV  40 mg Intravenous Q24H   Continuous Infusions: . sodium chloride 75 mL/hr at 03/11/15 0047    Principal Problem:   Nausea & vomiting Active Problems:   SIRS (systemic inflammatory response syndrome)   Tobacco abuse    Time spent: >35 minutes     Esperanza SheetsBURIEV, Jennel Mara N  [facility] Pager [phone]. If 7PM-7AM, please contact night-coverage at [email or web address], password [email or web address] 03/11/2015, 10:26 AM  LOS: 1 day

## 2015-03-12 DIAGNOSIS — R112 Nausea with vomiting, unspecified: Secondary | ICD-10-CM | POA: Diagnosis not present

## 2015-03-12 DIAGNOSIS — N200 Calculus of kidney: Secondary | ICD-10-CM | POA: Diagnosis not present

## 2015-03-12 DIAGNOSIS — R1013 Epigastric pain: Secondary | ICD-10-CM | POA: Diagnosis not present

## 2015-03-12 LAB — GLUCOSE, CAPILLARY: Glucose-Capillary: 79 mg/dL (ref 70–99)

## 2015-03-12 MED ORDER — NICOTINE 14 MG/24HR TD PT24: 14.0000 mg | MEDICATED_PATCH | Freq: Every day | TRANSDERMAL | Status: DC

## 2015-03-12 MED ORDER — ONDANSETRON 4 MG PO TBDP: ORAL_TABLET | ORAL | Status: DC

## 2015-03-12 MED ORDER — PANTOPRAZOLE SODIUM 20 MG PO TBEC: 20.0000 mg | DELAYED_RELEASE_TABLET | Freq: Every day | ORAL | Status: DC

## 2015-03-12 MED ORDER — ACETAMINOPHEN 325 MG PO TABS: 650.0000 mg | ORAL_TABLET | Freq: Four times a day (QID) | ORAL | Status: DC | PRN

## 2015-03-12 NOTE — Progress Notes (Signed)
Delmer IslamFred Persons to be D/C'd Home per MD order.  Discussed prescriptions and follow up appointments with the patient. Prescriptions given to patient, medication list explained in detail. Pt verbalized understanding.    Medication List    STOP taking these medications        ondansetron 4 MG tablet  Commonly known as:  ZOFRAN     simethicone 80 MG chewable tablet  Commonly known as:  MYLICON      TAKE these medications        acetaminophen 325 MG tablet  Commonly known as:  TYLENOL  Take 2 tablets (650 mg total) by mouth every 6 (six) hours as needed for mild pain (or Fever >/= 101).     dicyclomine 20 MG tablet  Commonly known as:  BENTYL  Take 1 tablet (20 mg total) by mouth 2 (two) times daily.     nicotine 14 mg/24hr patch  Commonly known as:  NICODERM CQ - dosed in mg/24 hours  Place 1 patch (14 mg total) onto the skin daily.     ondansetron 4 MG disintegrating tablet  Commonly known as:  ZOFRAN ODT  4mg  ODT q4 hours prn nausea/vomit     pantoprazole 20 MG tablet  Commonly known as:  PROTONIX  Take 1 tablet (20 mg total) by mouth daily.     promethazine 25 MG tablet  Commonly known as:  PHENERGAN  Take 1 tablet (25 mg total) by mouth every 6 (six) hours as needed for nausea or vomiting.        Filed Vitals:   03/12/15 0627  BP: 131/81  Pulse: 89  Temp: 98.1 F (36.7 C)  Resp: 18    Skin clean, dry and intact without evidence of skin break down, no evidence of skin tears noted. IV catheter discontinued intact. Site without signs and symptoms of complications. Dressing and pressure applied. Pt denies pain at this time. No complaints noted.  An After Visit Summary was printed and given to the patient. Patient escorted via WC, and D/C home via private auto.  Viviana SimplerDumas, Dimitriy Carreras S 03/12/2015 11:52 AM

## 2015-03-12 NOTE — Discharge Summary (Signed)
Physician Discharge Summary  Garrett Martinez AVW:[id number] DOB: 1991-05-08 DOA: 03/10/2015  PCP: No primary care provider on file.  Admit date: 03/10/2015 Discharge date: 03/12/2015  Time spent: >35 minutes  Recommendations for Outpatient Follow-up:  F/u with GI in 1-2 weeks F/u with PCP in 3-7 days   Discharge Diagnoses:  Principal Problem:   Nausea & vomiting Active Problems:   SIRS (systemic inflammatory response syndrome)   Tobacco abuse   Discharge Condition: stable   Diet recommendation: regular   Filed Weights   03/10/15 1944 03/11/15 0045  Weight: 58.968 kg (130 lb) 60.5 kg (133 lb 6.1 oz)    History of present illness:  24 y/o male with no significant medical history presented with nausea, vomiting, abdominal pains for several days. He was recently seen in ED multiple times.  -admitted with abdominal pain, vomiting    Hospital Course:  1. Nausea, vomiting, abdominal pain of unclear etiology. Likely viral gastroenteritis. Recently started on PPI per GI for possible gastritis. abd exam is unremarkable. CT abd: Nonobstructing 4 mm RIGHT upper pole nephrolithiasis -symptoms are improving on supportive care. cont antiemetics prn. Recommended to f/u with GI in 1-2 weeks. Pend TTG  -? Symptoms related to substance abuse. Initially patient denied using drugs, but urine tox + for cannabis, barbiturates, opioids? D/w patient, counseled about drug use    2. Nonobstructing 4 mm RIGHT upper pole nephrolithiasis. Patient denies back pains. Asymptomatic nephrolithiasis. Recent UA was unremarkable.  -recheck UA was unremarkable again. No back pains. F/u with urology as outpatient     Procedures:  none (i.e. Studies not automatically included, echos, thoracentesis, etc; not x-rays)  Consultations:  none  Discharge Exam: Filed Vitals:   03/12/15 0627  BP: 131/81  Pulse: 89  Temp: 98.1 F (36.7 C)  Resp: 18    General: alert, oriented. No distress  Cardiovascular: s1,s2  rrr Respiratory: CTA BL  Discharge Instructions  Discharge Instructions    Diet - low sodium heart healthy    Complete by:  As directed      Discharge instructions    Complete by:  As directed   Please follow up with gastroenterologist in 1-2 weeks     Increase activity slowly    Complete by:  As directed             Medication List    STOP taking these medications        ondansetron 4 MG tablet  Commonly known as:  ZOFRAN     simethicone 80 MG chewable tablet  Commonly known as:  MYLICON      TAKE these medications        acetaminophen 325 MG tablet  Commonly known as:  TYLENOL  Take 2 tablets (650 mg total) by mouth every 6 (six) hours as needed for mild pain (or Fever >/= 101).     dicyclomine 20 MG tablet  Commonly known as:  BENTYL  Take 1 tablet (20 mg total) by mouth 2 (two) times daily.     nicotine 14 mg/24hr patch  Commonly known as:  NICODERM CQ - dosed in mg/24 hours  Place 1 patch (14 mg total) onto the skin daily.     ondansetron 4 MG disintegrating tablet  Commonly known as:  ZOFRAN ODT  4mg  ODT q4 hours prn nausea/vomit     pantoprazole 20 MG tablet  Commonly known as:  PROTONIX  Take 1 tablet (20 mg total) by mouth daily.     promethazine 25 MG  tablet  Commonly known as:  PHENERGAN  Take 1 tablet (25 mg total) by mouth every 6 (six) hours as needed for nausea or vomiting.       No Known Allergies     Follow-up Information    Follow up with [facility]    . Call in 1 week.   Contact information:   [address] [place] [address] [phone]       The results of significant diagnostics from this hospitalization (including imaging, microbiology, ancillary and laboratory) are listed below for reference.    Significant Diagnostic Studies: Dg Chest 2 View  03/09/2015   CLINICAL DATA:  Abdominal pain and nausea since yesterday.  EXAM: CHEST  2 VIEW  COMPARISON:  None.  FINDINGS:  Normal mediastinum and cardiac silhouette. Normal pulmonary vasculature. No evidence of effusion, infiltrate, or pneumothorax. No acute bony abnormality.  IMPRESSION: Normal chest radiograph.   Electronically Signed   By: Genevive Bi M.D.   On: 03/09/2015 02:20   Ct Abdomen Pelvis W Contrast  03/09/2015   CLINICAL DATA:  Generalized abdominal pain with nausea and vomiting. Seen here yesterday for same symptoms.  EXAM: CT ABDOMEN AND PELVIS WITH CONTRAST  TECHNIQUE: Multidetector CT imaging of the abdomen and pelvis was performed using the standard protocol following bolus administration of intravenous contrast.  CONTRAST:  50mL OMNIPAQUE IOHEXOL 300 MG/ML SOLN, OMNIPAQUE IOHEXOL 300 MG/ML SOLN  COMPARISON:  CT of the abdomen and pelvis May 22, 2014  FINDINGS: LUNG BASES: Included view of the lung bases are clear. Visualized heart and pericardium are unremarkable.  SOLID ORGANS: The liver demonstrates subcentimeter granulomas in the RIGHT lobe, otherwise unremarkable. Spleen, gallbladder, pancreas and adrenal glands are unremarkable.  GASTROINTESTINAL TRACT: The stomach, small and large bowel are normal in course and caliber without inflammatory changes. Oral contrast in the stomach. Normal appendix.  KIDNEYS/ URINARY TRACT: Kidneys are orthotopic, demonstrating symmetric enhancement. 4 mm upper pole calculus. Early excretion of contrast limits assessment for small nonobstructing nephrolithiasis. No hydronephrosis or solid renal masses. The unopacified ureters are normal in course and caliber. Urinary bladder is partially distended and unremarkable.  PERITONEUM/RETROPERITONEUM: Aortoiliac vessels are normal in course and caliber. No lymphadenopathy by CT size criteria. Internal reproductive organs are unremarkable. Small amount of low-density ascites in the bilateral pericolic gutters extending into the pelvis. No abscess. No intraperitoneal free air.  SOFT TISSUE/OSSEOUS STRUCTURES: Non-suspicious.   IMPRESSION: Small amount of nonspecific ascites.  Normal appendix.  Nonobstructing 4 mm RIGHT upper pole nephrolithiasis.   Electronically Signed   By: Awilda Metro   On: 03/09/2015 04:01   Dg Abd Acute W/chest  03/11/2015   CLINICAL DATA:  24 year old male with mid abdominal pain and nausea with vomiting since this morning. A cups. Initial encounter.  EXAM: DG ABDOMEN ACUTE W/ 1V CHEST  COMPARISON:  CT Abdomen and Pelvis 03/09/2015 and earlier.  FINDINGS: Lung volumes remain normal. Normal cardiac size and mediastinal contours. Visualized tracheal air column is within normal limits. The lungs remain clear. No pneumothorax or pneumoperitoneum.  Stable right nephrolithiasis. Normal bowel gas pattern with some retained oral contrast in the colon. Poor delineation of abdominal and pelvic visceral contours, with a paucity of intra-abdominal fat noted on the recent CT. No osseous abnormality identified.  IMPRESSION: 1.  Normal bowel gas pattern, no free air. 2.  Negative chest, no acute cardiopulmonary abnormality. 3. Stable right nephrolithiasis.   Electronically Signed   By: Rexene Edison  Margo AyeHall M.D.   On: 03/11/2015 09:06    Microbiology: Recent Results (from the past 240 hour(s))  Culture, blood (x 2)     Status: None (Preliminary result)   Collection Time: 03/11/15  1:17 AM  Result Value Ref Range Status   Specimen Description LEFT ANTECUBITAL  Final   Special Requests NONE  Final   Culture   Final           BLOOD CULTURE RECEIVED NO GROWTH TO DATE CULTURE WILL BE HELD FOR 5 DAYS BEFORE ISSUING A FINAL NEGATIVE REPORT Performed at [facility]    Report Status PENDING  Incomplete  Culture, blood (x 2)     Status: None (Preliminary result)   Collection Time: 03/11/15  1:56 AM  Result Value Ref Range Status   Specimen Description BLOOD RIGHT WRIST  Final   Special Requests NONE  Final   Culture   Final           BLOOD CULTURE RECEIVED NO GROWTH TO DATE CULTURE WILL BE HELD FOR 5 DAYS BEFORE  ISSUING A FINAL NEGATIVE REPORT Performed at [facility]    Report Status PENDING  Incomplete     Labs: Basic Metabolic Panel:  Recent Labs Lab 03/07/15 2258 03/09/15 0211 03/10/15 2056 03/11/15 0556  NA 138 138 139 140  K 4.1 3.4* 3.7 3.9  CL 104 103 100* 106  CO2 28 26  --  29  GLUCOSE 105* 114* 115* 108*  BUN 11 11 13 12   CREATININE 1.18 1.36* 1.00 0.95  CALCIUM 9.5 9.5  --  8.9   Liver Function Tests:  Recent Labs Lab 03/07/15 2258 03/09/15 0211 03/10/15 2049 03/11/15 0556  AST 24 22 23 25   ALT 10 10* 10* 11*  ALKPHOS 60 57 51 43  BILITOT 1.0 0.9 1.8* 1.7*  PROT 7.8 7.6 8.3* 6.7  ALBUMIN 5.0 4.8 5.2* 4.2    Recent Labs Lab 03/07/15 2258 03/09/15 0211 03/10/15 2049  LIPASE 34 34 86*   No results for input(s): AMMONIA in the last 168 hours. CBC:  Recent Labs Lab 03/07/15 2258 03/09/15 0211 03/10/15 2049 03/10/15 2056 03/11/15 0556  WBC 4.9 5.8 6.9  --  6.1  NEUTROABS 3.5 4.4 5.0  --   --   HGB 14.7 14.8 15.0 17.0 13.3  HCT 44.5 44.0 44.5 50.0 41.2  MCV 83.6 83.0 82.9  --  84.1  PLT 217 224 227  --  196   Cardiac Enzymes: No results for input(s): CKTOTAL, CKMB, CKMBINDEX, TROPONINI in the last 168 hours. BNP: BNP (last 3 results) No results for input(s): BNP in the last 8760 hours.  ProBNP (last 3 results) No results for input(s): PROBNP in the last 8760 hours.  CBG:  Recent Labs Lab 03/11/15 0728 03/12/15 0751  GLUCAP 97 79       Signed:  Tanasia Budzinski N  [facility] 03/12/2015, 9:57 AM

## 2015-03-15 LAB — TISSUE TRANSGLUTAMINASE, IGA

## 2015-03-17 LAB — CULTURE, BLOOD (ROUTINE X 2)
Culture: NO GROWTH
Culture: NO GROWTH

## 2015-07-23 ENCOUNTER — Encounter (HOSPITAL_COMMUNITY): Payer: Self-pay | Admitting: Emergency Medicine

## 2015-07-23 ENCOUNTER — Emergency Department (HOSPITAL_COMMUNITY)
Admission: EM | Admit: 2015-07-23 | Discharge: 2015-07-23 | Disposition: A | Discharge status: Home / Self Care | Payer: BLUE CROSS/BLUE SHIELD | Attending: Emergency Medicine | Admitting: Emergency Medicine

## 2015-07-23 DIAGNOSIS — R197 Diarrhea, unspecified: Secondary | ICD-10-CM | POA: Insufficient documentation

## 2015-07-23 DIAGNOSIS — R112 Nausea with vomiting, unspecified: Secondary | ICD-10-CM | POA: Insufficient documentation

## 2015-07-23 DIAGNOSIS — Z720 Tobacco use: Secondary | ICD-10-CM | POA: Insufficient documentation

## 2015-07-23 DIAGNOSIS — R1084 Generalized abdominal pain: Secondary | ICD-10-CM | POA: Insufficient documentation

## 2015-07-23 LAB — URINE MICROSCOPIC-ADD ON

## 2015-07-23 LAB — COMPREHENSIVE METABOLIC PANEL
ALT: 12 U/L — ABNORMAL LOW (ref 17–63)
AST: 26 U/L (ref 15–41)
Albumin: 5.1 g/dL — ABNORMAL HIGH (ref 3.5–5.0)
Alkaline Phosphatase: 44 U/L (ref 38–126)
Anion gap: 9 (ref 5–15)
BUN: 13 mg/dL (ref 6–20)
CO2: 26 mmol/L (ref 22–32)
Calcium: 9.9 mg/dL (ref 8.9–10.3)
Chloride: 103 mmol/L (ref 101–111)
Creatinine, Ser: 1.19 mg/dL (ref 0.61–1.24)
GFR calc Af Amer: >60 mL/min (ref >60)
GFR calc non Af Amer: >60 mL/min (ref >60)
Glucose, Bld: 125 mg/dL — ABNORMAL HIGH (ref 65–99)
Potassium: 3.8 mmol/L (ref 3.5–5.1)
Sodium: 138 mmol/L (ref 135–145)
Total Bilirubin: 1.3 mg/dL — ABNORMAL HIGH (ref 0.3–1.2)
Total Protein: 8.3 g/dL — ABNORMAL HIGH (ref 6.5–8.1)

## 2015-07-23 LAB — URINALYSIS, ROUTINE W REFLEX MICROSCOPIC
Bilirubin Urine: NEGATIVE
Glucose, UA: NEGATIVE mg/dL
Hgb urine dipstick: NEGATIVE
Ketones, ur: 15 mg/dL — AB
Leukocytes, UA: NEGATIVE
Nitrite: NEGATIVE
Protein, ur: 30 mg/dL — AB
Specific Gravity, Urine: 1.041 — ABNORMAL HIGH (ref 1.005–1.030)
Urobilinogen, UA: 0.2 mg/dL (ref 0.0–1.0)
pH: 6 (ref 5.0–8.0)

## 2015-07-23 LAB — CBC
HCT: 42.4 % (ref 39.0–52.0)
Hemoglobin: 14.6 g/dL (ref 13.0–17.0)
MCH: 28.4 pg (ref 26.0–34.0)
MCHC: 34.4 g/dL (ref 30.0–36.0)
MCV: 82.5 fL (ref 78.0–100.0)
Platelets: 207 10*3/uL (ref 150–400)
RBC: 5.14 MIL/uL (ref 4.22–5.81)
RDW: 12 % (ref 11.5–15.5)
WBC: 7.1 10*3/uL (ref 4.0–10.5)

## 2015-07-23 LAB — RAPID URINE DRUG SCREEN, HOSP PERFORMED
Amphetamines: NOT DETECTED
Barbiturates: NOT DETECTED
Benzodiazepines: NOT DETECTED
Cocaine: NOT DETECTED
Opiates: NOT DETECTED
Tetrahydrocannabinol: POSITIVE — AB

## 2015-07-23 LAB — LIPASE, BLOOD: Lipase: 45 U/L (ref 22–51)

## 2015-07-23 MED ORDER — SODIUM CHLORIDE 0.9 % IV BOLUS (SEPSIS)
1000.0000 mL | Freq: Once | INTRAVENOUS | Status: AC
  Administered 2015-07-23: 1000 mL via INTRAVENOUS

## 2015-07-23 MED ORDER — ONDANSETRON HCL 4 MG/2ML IJ SOLN
4.0000 mg | Freq: Once | INTRAMUSCULAR | Status: AC
  Administered 2015-07-23: 4 mg via INTRAVENOUS
  Filled 2015-07-23: qty 2

## 2015-07-23 MED ORDER — ONDANSETRON 4 MG PO TBDP
4.0000 mg | ORAL_TABLET | Freq: Once | ORAL | Status: DC | PRN
  Filled 2015-07-23: qty 1

## 2015-07-23 MED ORDER — ONDANSETRON HCL 4 MG PO TABS: 4.0000 mg | ORAL_TABLET | Freq: Four times a day (QID) | ORAL | Status: DC

## 2015-07-23 NOTE — Discharge Instructions (Signed)
Nausea and Vomiting  Nausea is a sick feeling that often comes before throwing up (vomiting). Vomiting is a reflex where stomach contents come out of your mouth. Vomiting can cause severe loss of body fluids (dehydration). Children and elderly adults can become dehydrated quickly, especially if they also have diarrhea. Nausea and vomiting are symptoms of a condition or disease. It is important to find the cause of your symptoms.  CAUSES    Direct irritation of the stomach lining. This irritation can result from increased acid production (gastroesophageal reflux disease), infection, food poisoning, taking certain medicines (such as nonsteroidal anti-inflammatory drugs), alcohol use, or tobacco use.   Signals from the brain.These signals could be caused by a headache, heat exposure, an inner ear disturbance, increased pressure in the brain from injury, infection, a tumor, or a concussion, pain, emotional stimulus, or metabolic problems.   An obstruction in the gastrointestinal tract (bowel obstruction).   Illnesses such as diabetes, hepatitis, gallbladder problems, appendicitis, kidney problems, cancer, sepsis, atypical symptoms of a heart attack, or eating disorders.   Medical treatments such as chemotherapy and radiation.   Receiving medicine that makes you sleep (general anesthetic) during surgery.  DIAGNOSIS  Your caregiver may ask for tests to be done if the problems do not improve after a few days. Tests may also be done if symptoms are severe or if the reason for the nausea and vomiting is not clear. Tests may include:   Urine tests.   Blood tests.   Stool tests.   Cultures (to look for evidence of infection).   X-rays or other imaging studies.  Test results can help your caregiver make decisions about treatment or the need for additional tests.  TREATMENT  You need to stay well hydrated. Drink frequently but in small amounts.You may wish to drink water, sports drinks, clear broth, or eat frozen  ice pops or gelatin dessert to help stay hydrated.When you eat, eating slowly may help prevent nausea.There are also some antinausea medicines that may help prevent nausea.  HOME CARE INSTRUCTIONS    Take all medicine as directed by your caregiver.   If you do not have an appetite, do not force yourself to eat. However, you must continue to drink fluids.   If you have an appetite, eat a normal diet unless your caregiver tells you differently.   Eat a variety of complex carbohydrates (rice, wheat, potatoes, bread), lean meats, yogurt, fruits, and vegetables.   Avoid high-fat foods because they are more difficult to digest.   Drink enough water and fluids to keep your urine clear or pale yellow.   If you are dehydrated, ask your caregiver for specific rehydration instructions. Signs of dehydration may include:   Severe thirst.   Dry lips and mouth.   Dizziness.   Dark urine.   Decreasing urine frequency and amount.   Confusion.   Rapid breathing or pulse.  SEEK IMMEDIATE MEDICAL CARE IF:    You have blood or brown flecks (like coffee grounds) in your vomit.   You have black or bloody stools.   You have a severe headache or stiff neck.   You are confused.   You have severe abdominal pain.   You have chest pain or trouble breathing.   You do not urinate at least once every 8 hours.   You develop cold or clammy skin.   You continue to vomit for longer than 24 to 48 hours.   You have a fever.  MAKE SURE YOU:      Understand these instructions.   Will watch your condition.   Will get help right away if you are not doing well or get worse.  Document Released: 10/24/2005 Document Revised: 01/16/2012 Document Reviewed: 03/23/2011  ExitCare Patient Information 2015 [employer]. This information is not intended to replace advice given to you by your health care provider. Make sure you discuss any questions you have with your health care provider.      Abdominal Pain  Many things can cause  abdominal pain. Usually, abdominal pain is not caused by a disease and will improve without treatment. It can often be observed and treated at home. Your health care provider will do a physical exam and possibly order blood tests and X-rays to help determine the seriousness of your pain. However, in many cases, more time must pass before a clear cause of the pain can be found. Before that point, your health care provider may not know if you need more testing or further treatment.  HOME CARE INSTRUCTIONS   Monitor your abdominal pain for any changes. The following actions may help to alleviate any discomfort you are experiencing:   Only take over-the-counter or prescription medicines as directed by your health care provider.   Do not take laxatives unless directed to do so by your health care provider.   Try a clear liquid diet (broth, tea, or water) as directed by your health care provider. Slowly move to a bland diet as tolerated.  SEEK MEDICAL CARE IF:   You have unexplained abdominal pain.   You have abdominal pain associated with nausea or diarrhea.   You have pain when you urinate or have a bowel movement.   You experience abdominal pain that wakes you in the night.   You have abdominal pain that is worsened or improved by eating food.   You have abdominal pain that is worsened with eating fatty foods.   You have a fever.  SEEK IMMEDIATE MEDICAL CARE IF:    Your pain does not go away within 2 hours.   You keep throwing up (vomiting).   Your pain is felt only in portions of the abdomen, such as the right side or the left lower portion of the abdomen.   You pass bloody or black tarry stools.  MAKE SURE YOU:   Understand these instructions.    Will watch your condition.    Will get help right away if you are not doing well or get worse.   Document Released: 08/03/2005 Document Revised: 10/29/2013 Document Reviewed: 07/03/2013  Exit[employer] Patient Information 2015 [employer]. This information  is not intended to replace advice given to you by your health care provider. Make sure you discuss any questions you have with your health care provider.

## 2015-07-23 NOTE — ED Notes (Signed)
Patient ambulated 70 feet with no assistance and with no problems.

## 2015-07-23 NOTE — ED Provider Notes (Signed)
CSN: [id number]     Arrival date & time 07/23/15  4098 History   First MD Initiated Contact with Patient 07/23/15 1049     Chief Complaint  Patient presents with  . Abdominal Pain    mid abd   HPI  Garrett Martinez is a 24 year old male presenting with nausea and vomiting. Pt states that he had onset of nausea over the past 2-3 days with multiple episodes of vomiting in the past day. Pt reports up to 10 episodes of NBNB vomit. Pt also endorsing generalized abdominal cramping. One episode of non-bloody diarrhea last night. Pt has not taken anything to try and alleviate nausea or pain. No sick contacts or recent travel. Pt is seen frequently for NV with a recent hospitalization in May for intractable nausea/vomiting. Pt endorses frequent marijuana use. Denies fevers, chills, headache, chest pain, shortness of breath, dysuria or muscle aches.   Past Medical History  Diagnosis Date  . Tobacco abuse    History reviewed. No pertinent past surgical history. Family History  Problem Relation Age of Onset  . Lumbar disc disease Mother     Chronic low back pain   Social History  Substance Use Topics  . Smoking status: Current Every Day Smoker    Types: Cigars  . Smokeless tobacco: None  . Alcohol Use: Yes     Comment: "light social drinking"    Review of Systems  Constitutional: Negative for fever and chills.  Respiratory: Negative for shortness of breath.   Cardiovascular: Negative for chest pain.  Gastrointestinal: Positive for nausea, vomiting, abdominal pain and diarrhea. Negative for blood in stool.  Genitourinary: Negative for dysuria.  Musculoskeletal: Negative for myalgias.  Neurological: Negative for syncope and headaches.      Allergies  Review of patient's allergies indicates no known allergies.  Home Medications   Prior to Admission medications   Medication Sig Start Date End Date Taking? Authorizing Provider  acetaminophen (TYLENOL) 325 MG tablet Take 2 tablets (650 mg  total) by mouth every 6 (six) hours as needed for mild pain (or Fever >/= 101). Patient not taking: Reported on 07/23/2015 03/12/15   Esperanza Sheets, MD  dicyclomine (BENTYL) 20 MG tablet Take 1 tablet (20 mg total) by mouth 2 (two) times daily. Patient not taking: Reported on 07/23/2015 03/09/15   Junius Finner, PA-C  nicotine (NICODERM CQ - DOSED IN MG/24 HOURS) 14 mg/24hr patch Place 1 patch (14 mg total) onto the skin daily. Patient not taking: Reported on 07/23/2015 03/12/15   Esperanza Sheets, MD  ondansetron (ZOFRAN ODT) 4 MG disintegrating tablet  ODT q4 hours prn nausea/vomit Patient not taking: Reported on 07/23/2015 03/12/15   Esperanza Sheets, MD  ondansetron (ZOFRAN) 4 MG tablet Take 1 tablet (4 mg total) by mouth every 6 (six) hours. 07/23/15   Zilda No, PA-C  pantoprazole (PROTONIX) 20 MG tablet Take 1 tablet (20 mg total) by mouth daily. Patient not taking: Reported on 07/23/2015 03/12/15   Esperanza Sheets, MD  promethazine (PHENERGAN) 25 MG tablet Take 1 tablet (25 mg total) by mouth every 6 (six) hours as needed for nausea or vomiting. Patient not taking: Reported on 07/23/2015 03/09/15   Junius Finner, PA-C   BP 114/62 mmHg  Pulse 73  Temp(Src) 98.3 F (36.8 C) (Oral)  Resp 16  Ht  (1.803 m)  Wt 141 lb (63.957 kg)  BMI 19.67 kg/m2  SpO2 100% Physical Exam  Constitutional: He appears well-developed and well-nourished. No  distress.  HENT:  Head: Normocephalic and atraumatic.  Eyes: Conjunctivae are normal. Right eye exhibits no discharge. Left eye exhibits no discharge. No scleral icterus.  Neck: Normal range of motion.  Cardiovascular: Normal rate, regular rhythm and normal heart sounds.   Pulmonary/Chest: Effort normal and breath sounds normal. No respiratory distress. He has no wheezes. He has no rales.  Abdominal: Soft. Bowel sounds are normal. There is tenderness (generalized). There is no rebound and no guarding.  Abdomen is soft, non-distended. Mild, generalized  TTP.  Musculoskeletal: Normal range of motion.  Pt ambulates to bathroom with steady gait.  Neurological: He is alert. Coordination normal.  Skin: Skin is warm and dry.  Psychiatric: He has a normal mood and affect. His behavior is normal.  Nursing note and vitals reviewed.   ED Course  Procedures (including critical care time) Labs Review Labs Reviewed  COMPREHENSIVE METABOLIC PANEL - Abnormal; Notable for the following:    Glucose, Bld 125 (*)    Total Protein 8.3 (*)    Albumin 5.1 (*)    ALT 12 (*)    Total Bilirubin 1.3 (*)    All other components within normal limits  URINALYSIS, ROUTINE W REFLEX MICROSCOPIC (NOT AT [facility]) - Abnormal; Notable for the following:    Color, Urine AMBER (*)    Specific Gravity, Urine 1.041 (*)    Ketones, ur 15 (*)    Protein, ur 30 (*)    All other components within normal limits  URINE RAPID DRUG SCREEN, HOSP PERFORMED - Abnormal; Notable for the following:    Tetrahydrocannabinol POSITIVE (*)    All other components within normal limits  LIPASE, BLOOD  CBC  URINE MICROSCOPIC-ADD ON    Imaging Review No results found. I have personally reviewed and evaluated these images and lab results as part of my medical decision-making.   EKG Interpretation None      MDM   Final diagnoses:  Nausea and vomiting, vomiting of unspecified type  Generalized abdominal pain   Pt presenting with nausea, vomiting and generalized abdominal pain. This is a chronic problem; pt has been treated in ED for this multiple times with recent hospitalization in May for intractable NV. Pt given follow up instructions to see GI multiple times but reports he has not scheduled an appointment yet. VSS. Pt resting comfortably in no distress. Generalized abdominal pain, no guarding or rebound. No distension. Labs unremarkable. Urine positive for cannabis. WIll give IVF, pain management and anti-nausea then reassess. No indications for imaging at this time. Repeat  abdominal exam - soft, generalized tenderness, no distension, guarding or rebound. Pt reports improvement in symptoms. Has not vomited in ED. Pt drinking water and eating graham crackers with no issue. 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 Will discharge with zofran. Return precautions discussed with pt and given in discharge paperwork. Pt stable for discharge.      Rolm Gala Isabellah Sobocinski, PA-C 07/23/15 2211  Doug Sou, MD 07/26/15 [phone]

## 2015-07-23 NOTE — ED Notes (Signed)
Patient c/o mid generalized abd pain. Patient states it feels like this did the last time he was in the hospital (03/2015). Patient reports emesis >10 in past day. Patient also has diarrhea yesterday.

## 2015-07-24 ENCOUNTER — Emergency Department (HOSPITAL_COMMUNITY)
Admission: EM | Admit: 2015-07-24 | Discharge: 2015-07-25 | Disposition: A | Discharge status: Home / Self Care | Payer: BLUE CROSS/BLUE SHIELD | Attending: Emergency Medicine | Admitting: Emergency Medicine

## 2015-07-24 ENCOUNTER — Encounter (HOSPITAL_COMMUNITY): Payer: Self-pay | Admitting: Emergency Medicine

## 2015-07-24 DIAGNOSIS — R197 Diarrhea, unspecified: Secondary | ICD-10-CM | POA: Insufficient documentation

## 2015-07-24 DIAGNOSIS — R109 Unspecified abdominal pain: Secondary | ICD-10-CM | POA: Insufficient documentation

## 2015-07-24 DIAGNOSIS — Z720 Tobacco use: Secondary | ICD-10-CM | POA: Insufficient documentation

## 2015-07-24 DIAGNOSIS — R112 Nausea with vomiting, unspecified: Secondary | ICD-10-CM | POA: Diagnosis not present

## 2015-07-24 LAB — CBC
HCT: 44 % (ref 39.0–52.0)
Hemoglobin: 15.3 g/dL (ref 13.0–17.0)
MCH: 28.4 pg (ref 26.0–34.0)
MCHC: 34.8 g/dL (ref 30.0–36.0)
MCV: 81.6 fL (ref 78.0–100.0)
PLATELETS: 212 10*3/uL (ref 150–400)
RBC: 5.39 MIL/uL (ref 4.22–5.81)
RDW: 12 % (ref 11.5–15.5)
WBC: 6.1 10*3/uL (ref 4.0–10.5)

## 2015-07-24 LAB — LIPASE, BLOOD: LIPASE: 116 U/L — AB (ref 22–51)

## 2015-07-24 LAB — COMPREHENSIVE METABOLIC PANEL
ALBUMIN: 5.1 g/dL — AB (ref 3.5–5.0)
ALK PHOS: 46 U/L (ref 38–126)
ALT: 15 U/L — AB (ref 17–63)
AST: 26 U/L (ref 15–41)
Anion gap: 11 (ref 5–15)
BUN: 16 mg/dL (ref 6–20)
CALCIUM: 10.1 mg/dL (ref 8.9–10.3)
CHLORIDE: 101 mmol/L (ref 101–111)
CO2: 25 mmol/L (ref 22–32)
CREATININE: 1.25 mg/dL — AB (ref 0.61–1.24)
GFR calc Af Amer: >60 mL/min (ref >60)
GFR calc non Af Amer: >60 mL/min (ref >60)
GLUCOSE: 104 mg/dL — AB (ref 65–99)
Potassium: 3.5 mmol/L (ref 3.5–5.1)
SODIUM: 137 mmol/L (ref 135–145)
Total Bilirubin: 1.5 mg/dL — ABNORMAL HIGH (ref 0.3–1.2)
Total Protein: 8.1 g/dL (ref 6.5–8.1)

## 2015-07-24 MED ORDER — SODIUM CHLORIDE 0.9 % IV BOLUS (SEPSIS)
1000.0000 mL | Freq: Once | INTRAVENOUS | Status: AC
  Administered 2015-07-24: 1000 mL via INTRAVENOUS

## 2015-07-24 MED ORDER — ONDANSETRON HCL 4 MG/2ML IJ SOLN
4.0000 mg | Freq: Once | INTRAMUSCULAR | Status: AC
  Administered 2015-07-24: 4 mg via INTRAVENOUS
  Filled 2015-07-24: qty 2

## 2015-07-24 MED ORDER — PROMETHAZINE HCL 25 MG PO TABS: 25.0000 mg | ORAL_TABLET | Freq: Four times a day (QID) | ORAL | Status: DC | PRN

## 2015-07-24 MED ORDER — CHLORPROMAZINE HCL 25 MG PO TABS
25.0000 mg | ORAL_TABLET | Freq: Once | ORAL | Status: AC
  Administered 2015-07-24: 25 mg via ORAL
  Filled 2015-07-24: qty 1

## 2015-07-24 MED ORDER — ONDANSETRON 4 MG PO TBDP
4.0000 mg | ORAL_TABLET | Freq: Once | ORAL | Status: AC | PRN
  Administered 2015-07-24: 4 mg via ORAL
  Filled 2015-07-24: qty 1

## 2015-07-24 NOTE — Discharge Instructions (Signed)

## 2015-07-24 NOTE — ED Notes (Addendum)
Pt reports he was here yesterday for emesis. Was discharged, but emesis began again. Has tried different PO fluids, but emesis still occurs. No diarrhea. Also accompanied by abd pain and weakness. Tried to take a dose home zofran, but threw it back up.

## 2015-07-24 NOTE — ED Provider Notes (Signed)
CSN: [id number]     Arrival date & time 07/24/15  1717 History   First MD Initiated Contact with Patient 07/24/15 2103     Chief Complaint  Patient presents with  . Emesis     (Consider location/radiation/quality/duration/timing/severity/associated sxs/prior Treatment) HPI Comments: The patient is a 24 year old male, he has a history of marijuana, history of recurrent nausea vomiting and abdominal pain which has been consistent over the last couple of years coming in waves every several months. He has been referred to gastroenterology, and according to the patient have recommended no specific interventions as they were unable to find an indication or reason for his symptoms. He states that he has had several days of abdominal pain which is diffuse, multiple episodes of nausea and vomiting and constant hiccups. He has also had watery diarrhea which started all of this, that has since resolved. There is no fevers chills coughing shortness of breath chest pain or swelling of the legs. He was seen yesterday in the emergency department, given Zofran, discharged with Zofran. He tolerated oral food and fluids prior to discharge  Patient is a 24 y.o. male presenting with vomiting. The history is provided by the patient.  Emesis   Past Medical History  Diagnosis Date  . Tobacco abuse    History reviewed. No pertinent past surgical history. Family History  Problem Relation Age of Onset  . Lumbar disc disease Mother     Chronic low back pain   Social History  Substance Use Topics  . Smoking status: Current Every Day Smoker    Types: Cigars  . Smokeless tobacco: None  . Alcohol Use: Yes     Comment: "light social drinking"    Review of Systems  Gastrointestinal: Positive for vomiting.  All other systems reviewed and are negative.     Allergies  Review of patient's allergies indicates no known allergies.  Home Medications   Prior to Admission medications   Medication Sig Start Date  End Date Taking? Authorizing Provider  ondansetron (ZOFRAN) 4 MG tablet Take 1 tablet (4 mg total) by mouth every 6 (six) hours. 07/23/15  Yes Stevi Barrett, PA-C  acetaminophen (TYLENOL) 325 MG tablet Take 2 tablets (650 mg total) by mouth every 6 (six) hours as needed for mild pain (or Fever >/= 101). Patient not taking: Reported on 07/23/2015 03/12/15   Esperanza Sheets, MD  dicyclomine (BENTYL) 20 MG tablet Take 1 tablet (20 mg total) by mouth 2 (two) times daily. Patient not taking: Reported on 07/23/2015 03/09/15   Junius Finner, PA-C  nicotine (NICODERM CQ - DOSED IN MG/24 HOURS) 14 mg/24hr patch Place 1 patch (14 mg total) onto the skin daily. Patient not taking: Reported on 07/23/2015 03/12/15   Esperanza Sheets, MD  ondansetron (ZOFRAN ODT) 4 MG disintegrating tablet 4mg  ODT q4 hours prn nausea/vomit Patient not taking: Reported on 07/23/2015 03/12/15   Esperanza Sheets, MD  pantoprazole (PROTONIX) 20 MG tablet Take 1 tablet (20 mg total) by mouth daily. Patient not taking: Reported on 07/23/2015 03/12/15   Esperanza Sheets, MD  promethazine (PHENERGAN) 25 MG tablet Take 1 tablet (25 mg total) by mouth every 6 (six) hours as needed for nausea or vomiting. 07/24/15   Eber Hong, MD   BP 119/85 mmHg  Pulse 78  Temp(Src) 99.8 F (37.7 C) (Oral)  Resp 19  SpO2 99% Physical Exam  Constitutional: He appears well-developed and well-nourished. No distress.  HENT:  Head: Normocephalic and atraumatic.  Mouth/Throat: Oropharynx  is clear and moist. No oropharyngeal exudate.  Eyes: Conjunctivae and EOM are normal. Pupils are equal, round, and reactive to light. Right eye exhibits no discharge. Left eye exhibits no discharge. No scleral icterus.  Neck: Normal range of motion. Neck supple. No JVD present. No thyromegaly present.  Cardiovascular: Normal rate, regular rhythm, normal heart sounds and intact distal pulses.  Exam reveals no gallop and no friction rub.   No murmur heard. Pulmonary/Chest: Effort  normal and breath sounds normal. No respiratory distress. He has no wheezes. He has no rales.  Abdominal: Soft. Bowel sounds are normal. He exhibits no distension and no mass. There is no tenderness.  Musculoskeletal: Normal range of motion. He exhibits no edema or tenderness.  Lymphadenopathy:    He has no cervical adenopathy.  Neurological: He is alert. Coordination normal.  Skin: Skin is warm and dry. No rash noted. No erythema.  Psychiatric: He has a normal mood and affect. His behavior is normal.  Nursing note and vitals reviewed.   ED Course  Procedures (including critical care time) Labs Review Labs Reviewed  COMPREHENSIVE METABOLIC PANEL - Abnormal; Notable for the following:    Glucose, Bld 104 (*)    Creatinine, Ser 1.25 (*)    Albumin 5.1 (*)    ALT 15 (*)    Total Bilirubin 1.5 (*)    All other components within normal limits  LIPASE, BLOOD - Abnormal; Notable for the following:    Lipase 116 (*)    All other components within normal limits  CBC    Imaging Review No results found. I have personally reviewed and evaluated these images and lab results as part of my medical decision-making.    MDM   Final diagnoses:  Non-intractable vomiting with nausea, vomiting of unspecified type    No distress, frequent hiccuping, vital signs unremarkable, lab work thus far is unremarkable, he appears to likely be dehydrated from his recurrent nausea and vomiting. Medications ordered, IV fluids ordered, really reevaluate. Doubt significant abdominal pathology, possibly related to cyclic vomiting syndrome and his marijuana use  No sx after meds - feeling much better - VS normal  Meds given in ED:  Medications  ondansetron (ZOFRAN-ODT) disintegrating tablet 4 mg (4 mg Oral Given 07/24/15 1747)  chlorproMAZINE (THORAZINE) tablet 25 mg (25 mg Oral Given 07/24/15 2234)  sodium chloride 0.9 % bolus 1,000 mL (0 mLs Intravenous Stopped 07/24/15 2329)  sodium chloride 0.9 % bolus  1,000 mL (1,000 mLs Intravenous New Bag/Given 07/24/15 2329)  ondansetron (ZOFRAN) injection 4 mg (4 mg Intravenous Given 07/24/15 2226)    New Prescriptions   No medications on file      Eber Hong, MD 07/24/15 2343

## 2015-08-16 ENCOUNTER — Emergency Department (HOSPITAL_COMMUNITY)
Admission: EM | Admit: 2015-08-16 | Discharge: 2015-08-17 | Disposition: A | Discharge status: Home / Self Care | Payer: BLUE CROSS/BLUE SHIELD | Attending: Emergency Medicine | Admitting: Emergency Medicine

## 2015-08-16 ENCOUNTER — Encounter (HOSPITAL_COMMUNITY): Payer: Self-pay | Admitting: Nurse Practitioner

## 2015-08-16 DIAGNOSIS — Z720 Tobacco use: Secondary | ICD-10-CM | POA: Insufficient documentation

## 2015-08-16 DIAGNOSIS — F1210 Cannabis abuse, uncomplicated: Secondary | ICD-10-CM

## 2015-08-16 DIAGNOSIS — R1115 Cyclical vomiting syndrome unrelated to migraine: Secondary | ICD-10-CM

## 2015-08-16 DIAGNOSIS — R112 Nausea with vomiting, unspecified: Secondary | ICD-10-CM | POA: Insufficient documentation

## 2015-08-16 LAB — COMPREHENSIVE METABOLIC PANEL
ALK PHOS: 44 U/L (ref 38–126)
ALT: 19 U/L (ref 17–63)
ANION GAP: 10 (ref 5–15)
AST: 24 U/L (ref 15–41)
Albumin: 5.3 g/dL — ABNORMAL HIGH (ref 3.5–5.0)
BILIRUBIN TOTAL: 1.7 mg/dL — AB (ref 0.3–1.2)
BUN: 21 mg/dL — ABNORMAL HIGH (ref 6–20)
CALCIUM: 10.1 mg/dL (ref 8.9–10.3)
CO2: 28 mmol/L (ref 22–32)
Chloride: 101 mmol/L (ref 101–111)
Creatinine, Ser: 1.16 mg/dL (ref 0.61–1.24)
Glucose, Bld: 115 mg/dL — ABNORMAL HIGH (ref 65–99)
Potassium: 3.6 mmol/L (ref 3.5–5.1)
Sodium: 139 mmol/L (ref 135–145)
TOTAL PROTEIN: 8.7 g/dL — AB (ref 6.5–8.1)

## 2015-08-16 LAB — CBC
HCT: 45.1 % (ref 39.0–52.0)
HEMOGLOBIN: 15.6 g/dL (ref 13.0–17.0)
MCH: 28.7 pg (ref 26.0–34.0)
MCHC: 34.6 g/dL (ref 30.0–36.0)
MCV: 82.9 fL (ref 78.0–100.0)
PLATELETS: 232 10*3/uL (ref 150–400)
RBC: 5.44 MIL/uL (ref 4.22–5.81)
RDW: 12.3 % (ref 11.5–15.5)
WBC: 5.2 10*3/uL (ref 4.0–10.5)

## 2015-08-16 LAB — LIPASE, BLOOD: Lipase: 31 U/L (ref 22–51)

## 2015-08-16 MED ORDER — HALOPERIDOL LACTATE 5 MG/ML IJ SOLN
2.0000 mg | Freq: Once | INTRAMUSCULAR | Status: DC
  Filled 2015-08-16: qty 1

## 2015-08-16 MED ORDER — DICYCLOMINE HCL 10 MG/ML IM SOLN
20.0000 mg | Freq: Once | INTRAMUSCULAR | Status: AC
  Administered 2015-08-17: 20 mg via INTRAMUSCULAR
  Filled 2015-08-16: qty 2

## 2015-08-16 NOTE — ED Notes (Signed)
Requested urine from patient. Patient given an urinal.

## 2015-08-16 NOTE — ED Notes (Signed)
Pt is unable to urinate at this time. 

## 2015-08-16 NOTE — ED Notes (Signed)
Pt presents with n/v and abdominal pain. Endorses this has been ongoing problem since June without resolve. Running a low grade fever at triage.

## 2015-08-16 NOTE — ED Provider Notes (Signed)
CSN: [id number]     Arrival date & time 08/16/15  2039 History  By signing my name below, I, Ronney Lion, attest that this documentation has been prepared under the direction and in the presence of Raymonda Pell, MD. Electronically Signed: Ronney Lion, ED Scribe. 08/16/2015. 4:01 AM.  Chief Complaint  Patient presents with  . Abdominal Pain  . Nausea  . Emesis   Patient is a 24 y.o. male presenting with abdominal pain and vomiting. The history is provided by the patient.  Abdominal Pain Pain location:  Generalized Pain quality: aching   Pain radiates to:  Does not radiate Pain severity:  Moderate Onset quality:  Gradual Duration: 4 months. Timing:  Intermittent Progression:  Unchanged Chronicity:  Chronic Context: not sick contacts (no known contacts)   Relieved by:  None tried Worsened by:  Nothing tried Ineffective treatments:  None tried Associated symptoms: diarrhea, nausea and vomiting   Risk factors: no alcohol abuse   Emesis Associated symptoms: diarrhea     HPI Comments: Garrett Martinez is a 24 y.o. male who presents to the Emergency Department complaining of chronic, intermittent episodes of aching abdominal pain that began about 4 months ago. The latest episode began 3 days ago. Associated symptoms includes diarrhea, nausea, and vomiting. Patient denies any known sick contact, but he reports he has children at home who are in daycare. He reports he was evaluated at [facility], but they were unable to find any cause for his symptoms. However, he denies having an endoscopy there. Patient reports his GI doctor instructed him to stop smoking marijuana and tobacco. He states he completely quit smoking tobacco but has been smoking marijuana intermittently. He last smoked marijuana 2 weeks ago. Patient reports he is exposed to secondhand tobacco and marijuana smoke at his job. He was last exposed to secondhand marijuana smoke 2 days ago.  Patient also complains of night sweats.   Past  Medical History  Diagnosis Date  . Tobacco abuse    History reviewed. No pertinent past surgical history. Family History  Problem Relation Age of Onset  . Lumbar disc disease Mother     Chronic low back pain   Social History  Substance Use Topics  . Smoking status: Current Every Day Smoker    Types: Cigars  . Smokeless tobacco: None  . Alcohol Use: Yes     Comment: "light social drinking"    Review of Systems  Gastrointestinal: Positive for nausea, vomiting and diarrhea.  All other systems reviewed and are negative.  Allergies  Review of patient's allergies indicates no known allergies.  Home Medications   Prior to Admission medications   Medication Sig Start Date End Date Taking? Authorizing Provider  ondansetron (ZOFRAN ODT) 4 MG disintegrating tablet 4mg  ODT q4 hours prn nausea/vomit Patient taking differently: Take 4 mg by mouth every 8 (eight) hours as needed for nausea. 4mg  ODT q4 hours prn nausea/vomit 03/12/15  Yes Esperanza Sheets, MD  promethazine (PHENERGAN) 25 MG tablet Take 1 tablet (25 mg total) by mouth every 6 (six) hours as needed for nausea or vomiting. 07/24/15  Yes Eber Hong, MD  acetaminophen (TYLENOL) 325 MG tablet Take 2 tablets (650 mg total) by mouth every 6 (six) hours as needed for mild pain (or Fever >/= 101). Patient not taking: Reported on 07/23/2015 03/12/15   Esperanza Sheets, MD  dicyclomine (BENTYL) 20 MG tablet Take 1 tablet (20 mg total) by mouth 2 (two) times daily. Patient not taking: Reported on  07/23/2015 03/09/15   Junius Finner, PA-C  nicotine (NICODERM CQ - DOSED IN MG/24 HOURS) 14 mg/24hr patch Place 1 patch (14 mg total) onto the skin daily. Patient not taking: Reported on 07/23/2015 03/12/15   Esperanza Sheets, MD  ondansetron (ZOFRAN) 4 MG tablet Take 1 tablet (4 mg total) by mouth every 6 (six) hours. Patient not taking: Reported on 08/16/2015 07/23/15   Rolm Gala Barrett, PA-C  pantoprazole (PROTONIX) 20 MG tablet Take 1 tablet (20 mg total)  by mouth daily. Patient not taking: Reported on 07/23/2015 03/12/15   Esperanza Sheets, MD   BP 124/85 mmHg  Pulse 86  Temp(Src) 100.1 F (37.8 C) (Oral)  Resp 16  SpO2 99% Physical Exam  Constitutional: He is oriented to person, place, and time. He appears well-developed and well-nourished. No distress.  HENT:  Head: Normocephalic and atraumatic.  Mouth/Throat: Oropharynx is clear and moist. No oropharyngeal exudate.  Moist mm. No exudate.  Eyes: Conjunctivae and EOM are normal. Pupils are equal, round, and reactive to light.  Neck: Normal range of motion. Neck supple. No tracheal deviation present.  Cardiovascular: Normal rate and regular rhythm.   Pulmonary/Chest: Effort normal and breath sounds normal. No respiratory distress. He has no wheezes. He has no rales.  Lungs are clear to auscultation.   Abdominal: Soft. He exhibits no distension and no mass. Bowel sounds are increased. There is no tenderness. There is no rebound and no guarding.  Hyperactive bowel sounds. Negative McBurney's point.  Musculoskeletal: Normal range of motion.  Neurological: He is alert and oriented to person, place, and time.  Skin: Skin is warm and dry.  Psychiatric: He has a normal mood and affect. His behavior is normal.  Nursing note and vitals reviewed.   ED Course  Procedures (including critical care time)  DIAGNOSTIC STUDIES: Oxygen Saturation is 99% on RA, normal by my interpretation.    COORDINATION OF CARE: 11:46 PM - pt made aware of lab results. Discussed treatment plan with pt at bedside which includes avoiding exposure to marijuana and cigarette smoke - advised pt to purchase a mask to reduce exposure at work. Will also perform abdominal XR/CXR. Pt verbalized understanding and agreed to plan.   Labs Review Labs Reviewed  COMPREHENSIVE METABOLIC PANEL - Abnormal; Notable for the following:    Glucose, Bld 115 (*)    BUN 21 (*)    Total Protein 8.7 (*)    Albumin 5.3 (*)    Total  Bilirubin 1.7 (*)    All other components within normal limits  URINALYSIS, ROUTINE W REFLEX MICROSCOPIC (NOT AT [facility]) - Abnormal; Notable for the following:    Color, Urine AMBER (*)    Specific Gravity, Urine 1.037 (*)    Bilirubin Urine SMALL (*)    Protein, ur 30 (*)    All other components within normal limits  URINE RAPID DRUG SCREEN, HOSP PERFORMED - Abnormal; Notable for the following:    Tetrahydrocannabinol POSITIVE (*)    All other components within normal limits  LIPASE, BLOOD  CBC  URINE MICROSCOPIC-ADD ON    Imaging Review Dg Abd Acute W/chest  08/17/2015   CLINICAL DATA:  Mid abdominal pain for 5 days.  Vomiting today.  EXAM: DG ABDOMEN ACUTE W/ 1V CHEST  COMPARISON:  03/11/2015  FINDINGS: Normal heart size and pulmonary vascularity. No focal airspace disease or consolidation in the lungs. No blunting of costophrenic angles. No pneumothorax. Mediastinal contours appear intact.  Scattered gas and stool in the colon.  No small or large bowel distention. No free intra-abdominal air. No abnormal air-fluid levels. Calcification in the right upper quadrant is probably a granuloma in the liver. No radiopaque stones. Visualized bones appear intact.  IMPRESSION: No evidence of active pulmonary disease. Normal nonobstructive bowel gas pattern.   Electronically Signed   By: Burman Nieves M.D.   On: 08/17/2015 00:37   I have personally reviewed and evaluated these images and lab results as part of my medical decision-making.  MDM   Final diagnoses:  None  Need to stop using marijuana or this will return.  No further emesis in the ED. Exam is benign and reassuring no indication for advanced imaging at this time  I, Lorah Kalina-RASCH,Kelijah Towry K, personally performed the services described in this documentation. All medical record entries made by the scribe were at my direction and in my presence.  I have reviewed the chart and discharge instructions and agree that the record reflects my  personal performance and is accurate and complete. Kalayah Leske-RASCH,Harly Pipkins K.  08/17/2015. 4:38 AM.       Jaeven Wanzer, MD 08/17/15 [phone]

## 2015-08-17 ENCOUNTER — Encounter (HOSPITAL_COMMUNITY): Payer: Self-pay | Admitting: Emergency Medicine

## 2015-08-17 ENCOUNTER — Emergency Department (HOSPITAL_COMMUNITY): Payer: BLUE CROSS/BLUE SHIELD

## 2015-08-17 LAB — RAPID URINE DRUG SCREEN, HOSP PERFORMED
Amphetamines: NOT DETECTED
BARBITURATES: NOT DETECTED
BENZODIAZEPINES: NOT DETECTED
COCAINE: NOT DETECTED
OPIATES: NOT DETECTED
Tetrahydrocannabinol: POSITIVE — AB

## 2015-08-17 LAB — URINE MICROSCOPIC-ADD ON

## 2015-08-17 LAB — URINALYSIS, ROUTINE W REFLEX MICROSCOPIC
Glucose, UA: NEGATIVE mg/dL
Hgb urine dipstick: NEGATIVE
KETONES UR: NEGATIVE mg/dL
LEUKOCYTES UA: NEGATIVE
NITRITE: NEGATIVE
PH: 6.5 (ref 5.0–8.0)
PROTEIN: 30 mg/dL — AB
Specific Gravity, Urine: 1.037 — ABNORMAL HIGH (ref 1.005–1.030)
UROBILINOGEN UA: 1 mg/dL (ref 0.0–1.0)

## 2015-08-17 MED ORDER — HALOPERIDOL LACTATE 5 MG/ML IJ SOLN
2.0000 mg | Freq: Once | INTRAMUSCULAR | Status: AC
  Administered 2015-08-17: 2 mg via INTRAMUSCULAR

## 2015-10-25 ENCOUNTER — Encounter (HOSPITAL_COMMUNITY): Payer: Self-pay | Admitting: Emergency Medicine

## 2015-10-25 ENCOUNTER — Emergency Department (HOSPITAL_COMMUNITY)
Admission: EM | Admit: 2015-10-25 | Discharge: 2015-10-25 | Disposition: A | Discharge status: Home / Self Care | Payer: Self-pay | Attending: Emergency Medicine | Admitting: Emergency Medicine

## 2015-10-25 ENCOUNTER — Emergency Department (HOSPITAL_COMMUNITY): Payer: Self-pay

## 2015-10-25 DIAGNOSIS — K529 Noninfective gastroenteritis and colitis, unspecified: Secondary | ICD-10-CM | POA: Insufficient documentation

## 2015-10-25 DIAGNOSIS — F17210 Nicotine dependence, cigarettes, uncomplicated: Secondary | ICD-10-CM | POA: Insufficient documentation

## 2015-10-25 LAB — CBC WITH DIFFERENTIAL/PLATELET
Basophils Absolute: 0 10*3/uL (ref 0.0–0.1)
Basophils Relative: 0 %
EOS PCT: 0 %
Eosinophils Absolute: 0 10*3/uL (ref 0.0–0.7)
HEMATOCRIT: 43.1 % (ref 39.0–52.0)
Hemoglobin: 14.7 g/dL (ref 13.0–17.0)
LYMPHS PCT: 11 %
Lymphs Abs: 0.7 10*3/uL (ref 0.7–4.0)
MCH: 28 pg (ref 26.0–34.0)
MCHC: 34.1 g/dL (ref 30.0–36.0)
MCV: 82.1 fL (ref 78.0–100.0)
MONO ABS: 0.6 10*3/uL (ref 0.1–1.0)
MONOS PCT: 10 %
NEUTROS ABS: 5.2 10*3/uL (ref 1.7–7.7)
Neutrophils Relative %: 79 %
PLATELETS: 213 10*3/uL (ref 150–400)
RBC: 5.25 MIL/uL (ref 4.22–5.81)
RDW: 11.9 % (ref 11.5–15.5)
WBC: 6.6 10*3/uL (ref 4.0–10.5)

## 2015-10-25 LAB — COMPREHENSIVE METABOLIC PANEL
ALBUMIN: 5.1 g/dL — AB (ref 3.5–5.0)
ALT: 26 U/L (ref 17–63)
AST: 26 U/L (ref 15–41)
Alkaline Phosphatase: 46 U/L (ref 38–126)
Anion gap: 12 (ref 5–15)
BUN: 13 mg/dL (ref 6–20)
CHLORIDE: 105 mmol/L (ref 101–111)
CO2: 26 mmol/L (ref 22–32)
CREATININE: 1.11 mg/dL (ref 0.61–1.24)
Calcium: 10.2 mg/dL (ref 8.9–10.3)
GFR calc Af Amer: >60 mL/min (ref >60)
GFR calc non Af Amer: >60 mL/min (ref >60)
GLUCOSE: 128 mg/dL — AB (ref 65–99)
POTASSIUM: 3.7 mmol/L (ref 3.5–5.1)
SODIUM: 143 mmol/L (ref 135–145)
Total Bilirubin: 0.9 mg/dL (ref 0.3–1.2)
Total Protein: 8 g/dL (ref 6.5–8.1)

## 2015-10-25 LAB — LIPASE, BLOOD: LIPASE: 46 U/L (ref 11–51)

## 2015-10-25 MED ORDER — ONDANSETRON HCL 4 MG/2ML IJ SOLN
4.0000 mg | Freq: Once | INTRAMUSCULAR | Status: AC
  Administered 2015-10-25: 4 mg via INTRAVENOUS
  Filled 2015-10-25: qty 2

## 2015-10-25 MED ORDER — SODIUM CHLORIDE 0.9 % IV BOLUS (SEPSIS)
1000.0000 mL | Freq: Once | INTRAVENOUS | Status: AC
  Administered 2015-10-25: 1000 mL via INTRAVENOUS

## 2015-10-25 MED ORDER — ONDANSETRON HCL 4 MG PO TABS: 4.0000 mg | ORAL_TABLET | Freq: Four times a day (QID) | ORAL | Status: DC

## 2015-10-25 MED ORDER — KETOROLAC TROMETHAMINE 30 MG/ML IJ SOLN
30.0000 mg | Freq: Once | INTRAMUSCULAR | Status: AC
  Administered 2015-10-25: 30 mg via INTRAVENOUS
  Filled 2015-10-25: qty 1

## 2015-10-25 MED ORDER — SODIUM CHLORIDE 0.9 % IV BOLUS (SEPSIS)
1000.0000 mL | Freq: Once | INTRAVENOUS | Status: AC
Start: 2015-10-25 — End: 2015-10-25
  Administered 2015-10-25: 1000 mL via INTRAVENOUS

## 2015-10-25 MED ORDER — DICYCLOMINE HCL 20 MG PO TABS: ORAL_TABLET | ORAL | Status: DC

## 2015-10-25 MED ORDER — CHLORPROMAZINE HCL 25 MG/ML IJ SOLN
25.0000 mg | Freq: Once | INTRAMUSCULAR | Status: AC
  Administered 2015-10-25: 25 mg via INTRAMUSCULAR
  Filled 2015-10-25: qty 1

## 2015-10-25 NOTE — ED Notes (Signed)
UPON ATTEMPTING TO DISCHARGE THE PT, HE C/O CONTINUED NAUSEA AND HICCUPS. WILL INFORM THE ERA.

## 2015-10-25 NOTE — ED Notes (Signed)
Pt from home c/o shortness of breath, diarrhea, and vomiting since yesterday. He denies marijuana use.

## 2015-10-25 NOTE — ED Notes (Signed)
Pt reported improved nausea and  requested water. Provider agreed.  Pt given water but did not pass PO challenge.  He states that he is very nauseous once he drank.

## 2015-10-25 NOTE — ED Notes (Signed)
Patient transported to X-ray 

## 2015-10-25 NOTE — ED Notes (Signed)
PT DISCHARGED. INSTRUCTIONS AND PRESCRIPTIONS GIVEN. AAOX3. PT IN NO APPARENT DISTRESS OR PAIN. THE OPPORTUNITY TO ASK QUESTIONS WAS PROVIDED. 

## 2015-10-25 NOTE — Discharge Instructions (Signed)
Follow-up with Dr. Leone PayorGessner or one of his partners at [facility] in 1-2 weeks

## 2015-10-25 NOTE — ED Provider Notes (Signed)
CSN: [id number]     Arrival date & time 10/25/15  1020 History   First MD Initiated Contact with Patient 10/25/15 1029     Chief Complaint  Patient presents with  . Shortness of Breath  . Diarrhea  . Emesis     (Consider location/radiation/quality/duration/timing/severity/associated sxs/prior Treatment) Patient is a 24 y.o. male presenting with diarrhea. The history is provided by the patient (Patient complains of abdominal cramping diarrhea and vomiting for 1 day. She states that this is happened to him over last 6 months about 3 times.).  Diarrhea Quality:  Semi-solid Severity:  Moderate Onset quality:  Sudden Timing:  Constant Progression:  Unchanged Relieved by:  Nothing Ineffective treatments:  None tried Associated symptoms: abdominal pain and vomiting   Associated symptoms: no headaches     Past Medical History  Diagnosis Date  . Tobacco abuse    History reviewed. No pertinent past surgical history. Family History  Problem Relation Age of Onset  . Lumbar disc disease Mother     Chronic low back pain   Social History  Substance Use Topics  . Smoking status: Current Every Day Smoker    Types: Cigars  . Smokeless tobacco: None  . Alcohol Use: Yes     Comment: "light social drinking"    Review of Systems  Constitutional: Negative for appetite change and fatigue.  HENT: Negative for congestion, ear discharge and sinus pressure.   Eyes: Negative for discharge.  Respiratory: Negative for cough.   Cardiovascular: Negative for chest pain.  Gastrointestinal: Positive for vomiting, abdominal pain and diarrhea.  Genitourinary: Negative for frequency and hematuria.  Musculoskeletal: Negative for back pain.  Skin: Negative for rash.  Neurological: Negative for seizures and headaches.  Psychiatric/Behavioral: Negative for hallucinations.      Allergies  Review of patient's allergies indicates no known allergies.  Home Medications   Prior to Admission  medications   Medication Sig Start Date End Date Taking? Authorizing Provider  acetaminophen (TYLENOL) 325 MG tablet Take 2 tablets (650 mg total) by mouth every 6 (six) hours as needed for mild pain (or Fever >/= 101). Patient taking differently: Take 325-650 mg by mouth every 6 (six) hours as needed for mild pain (or Fever >/= 101).  03/12/15  Yes Esperanza Sheets, MD  ondansetron (ZOFRAN ODT) 4 MG disintegrating tablet  ODT q4 hours prn nausea/vomit Patient taking differently: Take 4 mg by mouth every 8 (eight) hours as needed for nausea.  ODT q4 hours prn nausea/vomit 03/12/15  Yes Esperanza Sheets, MD  dicyclomine (BENTYL) 20 MG tablet Take one every 6-8 hours for abd cramps 10/25/15   Bethann Berkshire, MD  nicotine (NICODERM CQ - DOSED IN MG/24 HOURS) 14 mg/24hr patch Place 1 patch (14 mg total) onto the skin daily. Patient not taking: Reported on 07/23/2015 03/12/15   Esperanza Sheets, MD  ondansetron (ZOFRAN) 4 MG tablet Take 1 tablet (4 mg total) by mouth every 6 (six) hours. 10/25/15   Bethann Berkshire, MD  pantoprazole (PROTONIX) 20 MG tablet Take 1 tablet (20 mg total) by mouth daily. Patient not taking: Reported on 07/23/2015 03/12/15   Esperanza Sheets, MD  promethazine (PHENERGAN) 25 MG tablet Take 1 tablet (25 mg total) by mouth every 6 (six) hours as needed for nausea or vomiting. Patient not taking: Reported on 10/25/2015 07/24/15   Eber Hong, MD   BP 112/76 mmHg  Pulse 61  Temp(Src) 98.5 F (36.9 C) (Oral)  Resp 16  SpO2 98% Physical  Exam  Constitutional: He is oriented to person, place, and time. He appears well-developed.  HENT:  Head: Normocephalic.  Eyes: Conjunctivae and EOM are normal. No scleral icterus.  Neck: Neck supple. No thyromegaly present.  Cardiovascular: Normal rate and regular rhythm.  Exam reveals no gallop and no friction rub.   No murmur heard. Pulmonary/Chest: No stridor. He has no wheezes. He has no rales. He exhibits no tenderness.  Abdominal: He  exhibits no distension. There is tenderness. There is no rebound.  Mild tenderness throughout  Musculoskeletal: Normal range of motion. He exhibits no edema.  Lymphadenopathy:    He has no cervical adenopathy.  Neurological: He is oriented to person, place, and time. He exhibits normal muscle tone. Coordination normal.  Skin: No rash noted. No erythema.  Psychiatric: He has a normal mood and affect. His behavior is normal.    ED Course  Procedures (including critical care time) Labs Review Labs Reviewed  COMPREHENSIVE METABOLIC PANEL - Abnormal; Notable for the following:    Glucose, Bld 128 (*)    Albumin 5.1 (*)    All other components within normal limits  CBC WITH DIFFERENTIAL/PLATELET  LIPASE, BLOOD    Imaging Review Dg Abd Acute W/chest  10/25/2015  CLINICAL DATA:  Shortness of breath and abdominal pain EXAM: DG ABDOMEN ACUTE W/ 1V CHEST COMPARISON:  08/17/2015 FINDINGS: The lungs are well aerated bilaterally. No focal infiltrate or sizable effusion is seen. No bony abnormality is noted. The abdomen shows right renal calculus stable from the prior exam. Scattered large and small bowel gas is noted. No free air is seen. IMPRESSION: Stable right renal calculus.  No acute abnormality noted. Electronically Signed   By: Alcide CleverMark  Lukens M.D.   On: 10/25/2015 11:47   I have personally reviewed and evaluated these images and lab results as part of my medical decision-making.   EKG Interpretation None      MDM   Final diagnoses:  Gastroenteritis    Labs unremarkable patient improved with treatment in emergency department. Patient hydrated with IV fluids. Patient sent home with Zofran and Bentyl and will be referred to GI since this is his third episode in 756 months    Bethann BerkshireJoseph Elyas Villamor, MD 10/25/15 [phone]

## 2015-10-27 ENCOUNTER — Encounter (HOSPITAL_COMMUNITY): Payer: Self-pay | Admitting: *Deleted

## 2015-10-27 ENCOUNTER — Emergency Department (HOSPITAL_COMMUNITY)
Admission: EM | Admit: 2015-10-27 | Discharge: 2015-10-27 | Discharge status: Against Medical Advice | Payer: Self-pay | Attending: Emergency Medicine | Admitting: Emergency Medicine

## 2015-10-27 DIAGNOSIS — R109 Unspecified abdominal pain: Secondary | ICD-10-CM | POA: Insufficient documentation

## 2015-10-27 DIAGNOSIS — F17210 Nicotine dependence, cigarettes, uncomplicated: Secondary | ICD-10-CM | POA: Insufficient documentation

## 2015-10-27 DIAGNOSIS — R1110 Vomiting, unspecified: Secondary | ICD-10-CM | POA: Insufficient documentation

## 2015-10-27 DIAGNOSIS — R197 Diarrhea, unspecified: Secondary | ICD-10-CM | POA: Insufficient documentation

## 2015-10-27 NOTE — ED Notes (Signed)
Pt agitated, waiting in the consultation room to speak to nurse. rn talking to pt, pt saying he was seen here on Sunday for the same thing. Pt asking why he needed to have blood work drawn again. rn explained because he came in to be evaluated again, pt said that "everything would come back the same". rn said well then they will tell you the same thing as they told you Sunday. Pt asking/demanding a work note for today. rn explained that he could get a work note for being in the ED on Sunday, but for pt to get a work note for today, pt had to stay and be seen. Pt said "fine I'll stay". rn asked pt to go to the lobby,  pt was upset and said he was going to leave, looked at the nurse and said "bitch", rn called [employer] who was at the security office. pt said "fuck you" and walked out.

## 2015-10-27 NOTE — ED Notes (Signed)
Pt upset and refusing blood draw.  States we got his blood Sunday and we shouldn't need to draw more blood.  Pt then states that he only need work note because he missed work 2 days due to sickness.  Notified nurse

## 2015-10-27 NOTE — ED Notes (Signed)
Pt complains of nausea, diarrhea, abdominal pain since Saturday. Pt was seen at [facility] for [facility] on Sunday, states his symptoms have not improved with zofran and bentyl. Pt states he cannot afford co-pay for GI referral.

## 2015-11-14 ENCOUNTER — Emergency Department (HOSPITAL_COMMUNITY)
Admission: EM | Admit: 2015-11-14 | Discharge: 2015-11-14 | Disposition: A | Discharge status: Home / Self Care | Payer: Self-pay | Attending: Emergency Medicine | Admitting: Emergency Medicine

## 2015-11-14 ENCOUNTER — Encounter (HOSPITAL_COMMUNITY): Payer: Self-pay | Admitting: *Deleted

## 2015-11-14 DIAGNOSIS — R112 Nausea with vomiting, unspecified: Secondary | ICD-10-CM | POA: Insufficient documentation

## 2015-11-14 DIAGNOSIS — Z79899 Other long term (current) drug therapy: Secondary | ICD-10-CM | POA: Insufficient documentation

## 2015-11-14 DIAGNOSIS — R1084 Generalized abdominal pain: Secondary | ICD-10-CM | POA: Insufficient documentation

## 2015-11-14 DIAGNOSIS — F17210 Nicotine dependence, cigarettes, uncomplicated: Secondary | ICD-10-CM | POA: Insufficient documentation

## 2015-11-14 LAB — COMPREHENSIVE METABOLIC PANEL
ALBUMIN: 4.7 g/dL (ref 3.5–5.0)
ALK PHOS: 46 U/L (ref 38–126)
ALT: 21 U/L (ref 17–63)
AST: 21 U/L (ref 15–41)
Anion gap: 11 (ref 5–15)
BILIRUBIN TOTAL: 1.5 mg/dL — AB (ref 0.3–1.2)
BUN: 17 mg/dL (ref 6–20)
CALCIUM: 9.4 mg/dL (ref 8.9–10.3)
CO2: 26 mmol/L (ref 22–32)
Chloride: 99 mmol/L — ABNORMAL LOW (ref 101–111)
Creatinine, Ser: 1.23 mg/dL (ref 0.61–1.24)
GFR calc Af Amer: >60 mL/min (ref >60)
GFR calc non Af Amer: >60 mL/min (ref >60)
GLUCOSE: 109 mg/dL — AB (ref 65–99)
Potassium: 3.3 mmol/L — ABNORMAL LOW (ref 3.5–5.1)
Sodium: 136 mmol/L (ref 135–145)
TOTAL PROTEIN: 7.5 g/dL (ref 6.5–8.1)

## 2015-11-14 LAB — CBC WITH DIFFERENTIAL/PLATELET
BASOS ABS: 0 10*3/uL (ref 0.0–0.1)
BASOS PCT: 1 %
Eosinophils Absolute: 0 10*3/uL (ref 0.0–0.7)
Eosinophils Relative: 1 %
HEMATOCRIT: 45.1 % (ref 39.0–52.0)
HEMOGLOBIN: 15.7 g/dL (ref 13.0–17.0)
LYMPHS ABS: 0.9 10*3/uL (ref 0.7–4.0)
LYMPHS PCT: 25 %
MCH: 28.5 pg (ref 26.0–34.0)
MCHC: 34.8 g/dL (ref 30.0–36.0)
MCV: 81.9 fL (ref 78.0–100.0)
Monocytes Absolute: 0.3 10*3/uL (ref 0.1–1.0)
Monocytes Relative: 9 %
Neutro Abs: 2.3 10*3/uL (ref 1.7–7.7)
Neutrophils Relative %: 64 %
Platelets: 215 10*3/uL (ref 150–400)
RBC: 5.51 MIL/uL (ref 4.22–5.81)
RDW: 12.1 % (ref 11.5–15.5)
WBC: 3.6 10*3/uL — AB (ref 4.0–10.5)

## 2015-11-14 LAB — LIPASE, BLOOD: Lipase: 51 U/L (ref 11–51)

## 2015-11-14 MED ORDER — PROMETHAZINE HCL 25 MG PO TABS: 25.0000 mg | ORAL_TABLET | ORAL | Status: DC | PRN

## 2015-11-14 MED ORDER — PANTOPRAZOLE SODIUM 20 MG PO TBEC: 20.0000 mg | DELAYED_RELEASE_TABLET | Freq: Every day | ORAL | Status: DC

## 2015-11-14 MED ORDER — SODIUM CHLORIDE 0.9 % IV BOLUS (SEPSIS)
1000.0000 mL | Freq: Once | INTRAVENOUS | Status: AC
  Administered 2015-11-14: 1000 mL via INTRAVENOUS

## 2015-11-14 MED ORDER — POTASSIUM CHLORIDE 10 MEQ/100ML IV SOLN
10.0000 meq | Freq: Once | INTRAVENOUS | Status: AC
  Administered 2015-11-14: 10 meq via INTRAVENOUS
  Filled 2015-11-14: qty 100

## 2015-11-14 MED ORDER — PROMETHAZINE HCL 25 MG/ML IJ SOLN
25.0000 mg | Freq: Once | INTRAMUSCULAR | Status: AC
  Administered 2015-11-14: 25 mg via INTRAVENOUS
  Filled 2015-11-14: qty 1

## 2015-11-14 MED ORDER — PANTOPRAZOLE SODIUM 40 MG IV SOLR
40.0000 mg | Freq: Once | INTRAVENOUS | Status: AC
  Administered 2015-11-14: 40 mg via INTRAVENOUS
  Filled 2015-11-14: qty 40

## 2015-11-14 NOTE — ED Provider Notes (Signed)
CSN: [id number]     Arrival date & time 11/14/15  [id number] History   First MD Initiated Contact with Patient 11/14/15 [phone]     Chief Complaint  Patient presents with  . Abdominal Pain     (Consider location/radiation/quality/duration/timing/severity/associated sxs/prior Treatment) HPI Comments: 25 year old male with history of tobacco abuse, marijuana abuse presents for abdominal pain, vomiting. The patient reports that over the last 3 days he has been very nauseous and has vomited multiple times. He denies any blood in his vomit. He denies any diarrhea. He reports that the pain is all over his abdomen. He reports this is very similar to the episode he had about 1 month ago. He says that he stopped using marijuana since last month because he was told that it could be related to his marijuana use. Denies any fevers or chills. Reports he's been taking Phenergan, Bentyl, Zofran without any relief. No known sick contacts. No recent antibiotic use. Has not made any follow-up appointments outpatient.  Patient is a 25 y.o. male presenting with abdominal pain.  Abdominal Pain Associated symptoms: nausea and vomiting   Associated symptoms: no chest pain, no chills, no cough, no diarrhea, no dysuria, no fatigue, no fever and no shortness of breath     Past Medical History  Diagnosis Date  . Tobacco abuse    History reviewed. No pertinent past surgical history. Family History  Problem Relation Age of Onset  . Lumbar disc disease Mother     Chronic low back pain   Social History  Substance Use Topics  . Smoking status: Current Every Day Smoker    Types: Cigars  . Smokeless tobacco: None  . Alcohol Use: Yes     Comment: "light social drinking"    Review of Systems  Constitutional: Negative for fever, chills and fatigue.  HENT: Negative for congestion, postnasal drip and rhinorrhea.   Eyes: Negative for pain and redness.  Respiratory: Negative for cough, chest tightness and shortness of breath.    Cardiovascular: Negative for chest pain and palpitations.  Gastrointestinal: Positive for nausea, vomiting and abdominal pain. Negative for diarrhea.  Genitourinary: Negative for dysuria, urgency and frequency.  Musculoskeletal: Negative for myalgias and back pain.  Skin: Negative for rash.  Neurological: Negative for dizziness, weakness and headaches.  Hematological: Does not bruise/bleed easily.      Allergies  Review of patient's allergies indicates no known allergies.  Home Medications   Prior to Admission medications   Medication Sig Start Date End Date Taking? Authorizing Provider  dicyclomine (BENTYL) 20 MG tablet Take one every 6-8 hours for abd cramps Patient taking differently: Take 20 mg by mouth. Take one every 6-8 hours for abd cramps 10/25/15  Yes Bethann BerkshireJoseph Zammit, MD  ondansetron (ZOFRAN) 4 MG tablet Take 1 tablet (4 mg total) by mouth every 6 (six) hours. 10/25/15  Yes Bethann BerkshireJoseph Zammit, MD  acetaminophen (TYLENOL) 325 MG tablet Take 2 tablets (650 mg total) by mouth every 6 (six) hours as needed for mild pain (or Fever >/= 101). Patient not taking: Reported on 11/14/2015 03/12/15   Esperanza SheetsUlugbek N Buriev, MD  nicotine (NICODERM CQ - DOSED IN MG/24 HOURS) 14 mg/24hr patch Place 1 patch (14 mg total) onto the skin daily. Patient not taking: Reported on 07/23/2015 03/12/15   Esperanza SheetsUlugbek N Buriev, MD  ondansetron (ZOFRAN ODT) 4 MG disintegrating tablet 4mg  ODT q4 hours prn nausea/vomit Patient not taking: Reported on 11/14/2015 03/12/15   Esperanza SheetsUlugbek N Buriev, MD  pantoprazole (PROTONIX) 20 MG tablet  Take 1 tablet (20 mg total) by mouth daily. 11/14/15   Leta Baptist, MD  promethazine (PHENERGAN) 25 MG tablet Take 1 tablet (25 mg total) by mouth every 4 (four) hours as needed for nausea or vomiting. 11/14/15   Leta Baptist, MD   BP 118/83 mmHg  Pulse 70  Temp(Src) 98.6 F (37 C) (Oral)  Resp 14  SpO2 100% Physical Exam  Constitutional: He is oriented to person, place, and time. He appears  well-developed and well-nourished. No distress.  HENT:  Head: Normocephalic and atraumatic.  Right Ear: External ear normal.  Left Ear: External ear normal.  Mouth/Throat: Oropharynx is clear and moist. No oropharyngeal exudate.  Eyes: EOM are normal. Pupils are equal, round, and reactive to light.  Neck: Normal range of motion. Neck supple.  Cardiovascular: Normal rate, regular rhythm, normal heart sounds and intact distal pulses.   No murmur heard. Pulmonary/Chest: Effort normal. No respiratory distress. He has no wheezes. He has no rales.  Abdominal: Soft. He exhibits no distension. There is tenderness (mild, generalized, diffuse without any focal area of tenderness).  Musculoskeletal: He exhibits no edema.  Neurological: He is alert and oriented to person, place, and time.  Skin: Skin is warm and dry. No rash noted. He is not diaphoretic.  Vitals reviewed.   ED Course  Procedures (including critical care time) Labs Review Labs Reviewed  CBC WITH DIFFERENTIAL/PLATELET - Abnormal; Notable for the following:    WBC 3.6 (*)    All other components within normal limits  COMPREHENSIVE METABOLIC PANEL - Abnormal; Notable for the following:    Potassium 3.3 (*)    Chloride 99 (*)    Glucose, Bld 109 (*)    Total Bilirubin 1.5 (*)    All other components within normal limits  LIPASE, BLOOD  URINALYSIS, ROUTINE W REFLEX MICROSCOPIC (NOT AT [facility])    Imaging Review No results found. I have personally reviewed and evaluated these images and lab results as part of my medical decision-making.   EKG Interpretation None      MDM  Patient was seen and evaluated in stable condition. Patient nontoxic in appearance. Stable vital signs. Diffuse mild tenderness on abdominal examination without any focal area of tenderness.  Patient was reevaluated multiple times and was resting and sleeping comfortably. After 2 L of IV fluids and replacement of his potassium he was woken up and was able to  drink some liquids. His vitals remained stable. He continued to have some nausea. He was discharged home in stable condition with prescriptions for Protonix and Phenergan. He was instructed to [facility] with the [facility] as well as GI. Patient was discharged home in stable condition. Final diagnoses:  Non-intractable vomiting with nausea, vomiting of unspecified type   1. Acute nausea and vomiting    Leta Baptist, MD 11/14/15 1255

## 2015-11-14 NOTE — ED Notes (Signed)
Patient has been asked for urine sample twice.

## 2015-11-14 NOTE — ED Notes (Addendum)
Patient complains of abdominal pain, nausea, emesis, and diarrhea today that has been going on for 3 days now. He was seen a month ago for the same and discharged with prescriptions. Patient did not follow up with Dr. Leone PayorGessner at [facility] as instructed during his visit on 10/25/15.

## 2015-11-14 NOTE — ED Notes (Signed)
MD at bedside. 

## 2015-11-14 NOTE — ED Notes (Signed)
Patient hasbeensat up and given sprite.

## 2015-11-14 NOTE — ED Notes (Signed)
Per tech patient spit drink into emesis bag. No emesis.

## 2015-11-14 NOTE — ED Notes (Signed)
Patient was given urinal, tried to give sample with no luck. Will follow up after IV has been placed.

## 2015-11-14 NOTE — Discharge Instructions (Signed)
You were seen and evaluated today for your nausea and vomiting. Take the medications as prescribed to help control her symptoms. Try to drink clear liquids including ginger ale, clear sodas as well as Gatorade to keep herself well hydrated. Rest at home. Make arrangements to follow up outpatient regarding this issue.  Nausea and Vomiting Nausea is a sick feeling that often comes before throwing up (vomiting). Vomiting is a reflex where stomach contents come out of your mouth. Vomiting can cause severe loss of body fluids (dehydration). Children and elderly adults can become dehydrated quickly, especially if they also have diarrhea. Nausea and vomiting are symptoms of a condition or disease. It is important to find the cause of your symptoms. CAUSES   Direct irritation of the stomach lining. This irritation can result from increased acid production (gastroesophageal reflux disease), infection, food poisoning, taking certain medicines (such as nonsteroidal anti-inflammatory drugs), alcohol use, or tobacco use.  Signals from the brain.These signals could be caused by a headache, heat exposure, an inner ear disturbance, increased pressure in the brain from injury, infection, a tumor, or a concussion, pain, emotional stimulus, or metabolic problems.  An obstruction in the gastrointestinal tract (bowel obstruction).  Illnesses such as diabetes, hepatitis, gallbladder problems, appendicitis, kidney problems, cancer, sepsis, atypical symptoms of a heart attack, or eating disorders.  Medical treatments such as chemotherapy and radiation.  Receiving medicine that makes you sleep (general anesthetic) during surgery. DIAGNOSIS Your caregiver may ask for tests to be done if the problems do not improve after a few days. Tests may also be done if symptoms are severe or if the reason for the nausea and vomiting is not clear. Tests may include:  Urine tests.  Blood tests.  Stool tests.  Cultures (to look  for evidence of infection).  X-rays or other imaging studies. Test results can help your caregiver make decisions about treatment or the need for additional tests. TREATMENT You need to stay well hydrated. Drink frequently but in small amounts.You may wish to drink water, sports drinks, clear broth, or eat frozen ice pops or gelatin dessert to help stay hydrated.When you eat, eating slowly may help prevent nausea.There are also some antinausea medicines that may help prevent nausea. HOME CARE INSTRUCTIONS   Take all medicine as directed by your caregiver.  If you do not have an appetite, do not force yourself to eat. However, you must continue to drink fluids.  If you have an appetite, eat a normal diet unless your caregiver tells you differently.  Eat a variety of complex carbohydrates (rice, wheat, potatoes, bread), lean meats, yogurt, fruits, and vegetables.  Avoid high-fat foods because they are more difficult to digest.  Drink enough water and fluids to keep your urine clear or pale yellow.  If you are dehydrated, ask your caregiver for specific rehydration instructions. Signs of dehydration may include:  Severe thirst.  Dry lips and mouth.  Dizziness.  Dark urine.  Decreasing urine frequency and amount.  Confusion.  Rapid breathing or pulse. SEEK IMMEDIATE MEDICAL CARE IF:   You have blood or brown flecks (like coffee grounds) in your vomit.  You have black or bloody stools.  You have a severe headache or stiff neck.  You are confused.  You have severe abdominal pain.  You have chest pain or trouble breathing.  You do not urinate at least once every 8 hours.  You develop cold or clammy skin.  You continue to vomit for longer than 24 to 48 hours.  You have a fever. MAKE SURE YOU:   Understand these instructions.  Will watch your condition.  Will get help right away if you are not doing well or get worse.   This information is not intended to  replace advice given to you by your health care provider. Make sure you discuss any questions you have with your health care provider.   Document Released: 10/24/2005 Document Revised: 01/16/2012 Document Reviewed: 03/23/2011 Elsevier Interactive Patient Education [employer]2016 [employer].

## 2015-11-14 NOTE — ED Notes (Signed)
Patient has had 300cc of watery emesis

## 2016-03-20 ENCOUNTER — Encounter (HOSPITAL_COMMUNITY): Payer: Self-pay

## 2016-03-20 ENCOUNTER — Emergency Department (HOSPITAL_COMMUNITY)
Admission: EM | Admit: 2016-03-20 | Discharge: 2016-03-20 | Disposition: A | Discharge status: Home / Self Care | Payer: Self-pay | Attending: Emergency Medicine | Admitting: Emergency Medicine

## 2016-03-20 DIAGNOSIS — R112 Nausea with vomiting, unspecified: Secondary | ICD-10-CM | POA: Insufficient documentation

## 2016-03-20 DIAGNOSIS — Z79899 Other long term (current) drug therapy: Secondary | ICD-10-CM | POA: Insufficient documentation

## 2016-03-20 DIAGNOSIS — Z791 Long term (current) use of non-steroidal anti-inflammatories (NSAID): Secondary | ICD-10-CM | POA: Insufficient documentation

## 2016-03-20 DIAGNOSIS — F17210 Nicotine dependence, cigarettes, uncomplicated: Secondary | ICD-10-CM | POA: Insufficient documentation

## 2016-03-20 LAB — CBC
HEMATOCRIT: 44.8 % (ref 39.0–52.0)
Hemoglobin: 15.8 g/dL (ref 13.0–17.0)
MCH: 28.6 pg (ref 26.0–34.0)
MCHC: 35.3 g/dL (ref 30.0–36.0)
MCV: 81 fL (ref 78.0–100.0)
PLATELETS: 222 10*3/uL (ref 150–400)
RBC: 5.53 MIL/uL (ref 4.22–5.81)
RDW: 11.8 % (ref 11.5–15.5)
WBC: 8 10*3/uL (ref 4.0–10.5)

## 2016-03-20 LAB — COMPREHENSIVE METABOLIC PANEL
ALBUMIN: 5.4 g/dL — AB (ref 3.5–5.0)
ALT: 25 U/L (ref 17–63)
AST: 36 U/L (ref 15–41)
Alkaline Phosphatase: 45 U/L (ref 38–126)
Anion gap: 13 (ref 5–15)
BILIRUBIN TOTAL: 1.9 mg/dL — AB (ref 0.3–1.2)
BUN: 22 mg/dL — AB (ref 6–20)
CHLORIDE: 100 mmol/L — AB (ref 101–111)
CO2: 25 mmol/L (ref 22–32)
CREATININE: 1.17 mg/dL (ref 0.61–1.24)
Calcium: 10.1 mg/dL (ref 8.9–10.3)
GFR calc Af Amer: >60 mL/min (ref >60)
GLUCOSE: 111 mg/dL — AB (ref 65–99)
POTASSIUM: 3.1 mmol/L — AB (ref 3.5–5.1)
Sodium: 138 mmol/L (ref 135–145)
TOTAL PROTEIN: 8.3 g/dL — AB (ref 6.5–8.1)

## 2016-03-20 LAB — LIPASE, BLOOD: Lipase: 70 U/L — ABNORMAL HIGH (ref 11–51)

## 2016-03-20 MED ORDER — SODIUM CHLORIDE 0.9 % IV BOLUS (SEPSIS)
1000.0000 mL | Freq: Once | INTRAVENOUS | Status: AC
  Administered 2016-03-20: 1000 mL via INTRAVENOUS

## 2016-03-20 MED ORDER — ONDANSETRON HCL 4 MG/2ML IJ SOLN
4.0000 mg | Freq: Once | INTRAMUSCULAR | Status: AC
  Administered 2016-03-20: 4 mg via INTRAVENOUS
  Filled 2016-03-20: qty 2

## 2016-03-20 MED ORDER — POTASSIUM CHLORIDE 10 MEQ/100ML IV SOLN
10.0000 meq | Freq: Once | INTRAVENOUS | Status: AC
  Administered 2016-03-20: 10 meq via INTRAVENOUS
  Filled 2016-03-20: qty 100

## 2016-03-20 MED ORDER — ONDANSETRON HCL 4 MG PO TABS: 4.0000 mg | ORAL_TABLET | Freq: Four times a day (QID) | ORAL | Status: DC

## 2016-03-20 MED ORDER — ONDANSETRON HCL 4 MG/2ML IJ SOLN: 4.0000 mg | Freq: Once | INTRAMUSCULAR | Status: DC | PRN

## 2016-03-20 NOTE — Discharge Instructions (Signed)

## 2016-03-20 NOTE — ED Notes (Signed)
PT aware of urine sample 

## 2016-03-20 NOTE — ED Provider Notes (Signed)
CSN: [id number]     Arrival date & time 03/20/16  1222 History   First MD Initiated Contact with Patient 03/20/16 1327     Chief Complaint  Patient presents with  . Emesis  . Abdominal Pain   HPI Comments: 25 year old male presents with nausea, vomiting, diarrhea for the past 3 days. He had multiple episodes of diarrhea on Thursday followed by nausea and vomiting which has been ongoing. His diarrhea has since resolved. Reports associated diaphoresis, chills, chest pain which occurs after vomiting. Denies sick contacts. Denies fever, shortness of breath, cough, wheezing, dysuria. He does have a history of marijuana use which he states he has cut back on. He has recently smoked a couple days ago.   Past Medical History  Diagnosis Date  . Tobacco abuse    History reviewed. No pertinent past surgical history. Family History  Problem Relation Age of Onset  . Lumbar disc disease Mother     Chronic low back pain   Social History  Substance Use Topics  . Smoking status: Current Every Day Smoker    Types: Cigars  . Smokeless tobacco: None  . Alcohol Use: Yes     Comment: "light social drinking"    Review of Systems  Constitutional: Positive for chills and diaphoresis. Negative for fever.  Respiratory: Negative for shortness of breath.   Cardiovascular: Positive for chest pain.  Gastrointestinal: Positive for nausea, vomiting, abdominal pain and diarrhea. Negative for constipation.  Genitourinary: Negative for dysuria and flank pain.  All other systems reviewed and are negative.     Allergies  Review of patient's allergies indicates no known allergies.  Home Medications   Prior to Admission medications   Medication Sig Start Date End Date Taking? Authorizing Provider  ibuprofen (ADVIL,MOTRIN) 200 MG tablet Take 200 mg by mouth every 6 (six) hours as needed for mild pain or moderate pain.   Yes Historical Provider, MD  acetaminophen (TYLENOL) 325 MG tablet Take 2 tablets (650 mg  total) by mouth every 6 (six) hours as needed for mild pain (or Fever >/= 101). Patient not taking: Reported on 11/14/2015 03/12/15   Esperanza SheetsUlugbek N Buriev, MD  dicyclomine (BENTYL) 20 MG tablet Take one every 6-8 hours for abd cramps Patient taking differently: Take 20 mg by mouth. Take one every 6-8 hours for abd cramps 10/25/15   Bethann BerkshireJoseph Zammit, MD  nicotine (NICODERM CQ - DOSED IN MG/24 HOURS) 14 mg/24hr patch Place 1 patch (14 mg total) onto the skin daily. Patient not taking: Reported on 07/23/2015 03/12/15   Esperanza SheetsUlugbek N Buriev, MD  ondansetron (ZOFRAN ODT) 4 MG disintegrating tablet 4mg  ODT q4 hours prn nausea/vomit Patient not taking: Reported on 11/14/2015 03/12/15   Esperanza SheetsUlugbek N Buriev, MD  ondansetron (ZOFRAN) 4 MG tablet Take 1 tablet (4 mg total) by mouth every 6 (six) hours. Patient not taking: Reported on 03/20/2016 10/25/15   Bethann BerkshireJoseph Zammit, MD  pantoprazole (PROTONIX) 20 MG tablet Take 1 tablet (20 mg total) by mouth daily. Patient not taking: Reported on 03/20/2016 11/14/15   Leta BaptistEmily Roe Nguyen, MD  promethazine (PHENERGAN) 25 MG tablet Take 1 tablet (25 mg total) by mouth every 4 (four) hours as needed for nausea or vomiting. Patient not taking: Reported on 03/20/2016 11/14/15   Leta BaptistEmily Roe Nguyen, MD   BP 109/69 mmHg  Pulse 68  Resp 13  Ht 5\' 11"  (1.803 m)  Wt 65.772 kg  BMI 20.23 kg/m2  SpO2 100%   Physical Exam  Constitutional: He is oriented  to person, place, and time. He appears well-developed and well-nourished. No distress.  HENT:  Head: Normocephalic and atraumatic.  Eyes: Conjunctivae are normal. Pupils are equal, round, and reactive to light. Right eye exhibits no discharge. Left eye exhibits no discharge. No scleral icterus.  Neck: Normal range of motion.  Cardiovascular: Normal rate and regular rhythm.  Exam reveals no gallop and no friction rub.   No murmur heard. Pulmonary/Chest: Effort normal and breath sounds normal. No respiratory distress. He has no wheezes. He has no rales. He  exhibits no tenderness.  Abdominal: Soft. Bowel sounds are normal. He exhibits no distension and no mass. There is tenderness. There is no rebound and no guarding.  Minimal diffuse tenderness  Neurological: He is alert and oriented to person, place, and time.  Skin: Skin is warm and dry.  Psychiatric: He has a normal mood and affect.    ED Course  Procedures (including critical care time) Labs Review Labs Reviewed  LIPASE, BLOOD - Abnormal; Notable for the following:    Lipase 70 (*)    All other components within normal limits  COMPREHENSIVE METABOLIC PANEL - Abnormal; Notable for the following:    Potassium 3.1 (*)    Chloride 100 (*)    Glucose, Bld 111 (*)    BUN 22 (*)    Total Protein 8.3 (*)    Albumin 5.4 (*)    Total Bilirubin 1.9 (*)    All other components within normal limits  CBC  URINALYSIS, ROUTINE W REFLEX MICROSCOPIC (NOT AT [facility])    Imaging Review No results found. I have personally reviewed and evaluated these images and lab results as part of my medical decision-making.   EKG Interpretation None     MDM   Final diagnoses:  Non-intractable vomiting with nausea, vomiting of unspecified type   25 year old male presents with an onset of nausea vomiting and diarrhea past couple days. He has been seen here multiple times for the same. He is afebrile and blood pressure has improved with administration of IV fluids. Physical exam is overall benign. It is soft and mildly tender. Further imaging indicated at this time. Chest pain is most likely due to his repeated vomiting. EKG is normal sinus rhythm. CBC is unremarkable CMP is remarkable for potassium 3.1. 10 mEq of potassium given on with another bag of IV fluids. Lipase is slightly elevated at 70. Patient is NAD, non-toxic, with stable VS. Patient is informed of clinical course, understands medical decision making process, and agrees with plan. Opportunity for questions provided and all questions answered. Return  precautions given.   Bethel Born, PA-C 03/20/16 1604  Geoffery Lyons, MD 03/21/16 0700

## 2016-03-20 NOTE — ED Notes (Addendum)
Pt c/o vomiting since Thursday and epigastric pain since today . Pt has not been able to hold down fluids. Pt had diarrhea but no longer does. Pt reports being in ED previously for similar symptoms

## 2016-03-20 NOTE — ED Notes (Signed)
PT DISCHARGED. INSTRUCTIONS AND PRESCRIPTION GIVEN. AAOX3. PT IN NO APPARENT DISTRESS OR PAIN. THE OPPORTUNITY TO ASK QUESTIONS WAS PROVIDED. 

## 2016-03-20 NOTE — ED Notes (Signed)
Pt made aware of need for urine sample.  Pt unable to provide one at this time.

## 2016-06-27 IMAGING — CT CT ABD-PELV W/ CM
2 of 4 series · 15 of 46 positions shown, 17 images · IV contrast (omnipaque)
Comparison: CT of the abdomen and pelvis May 22, 2014

CLINICAL DATA: Generalized abdominal pain with nausea and vomiting.
Seen here yesterday for same symptoms.

EXAM:
CT ABDOMEN AND PELVIS WITH CONTRAST
TECHNIQUE: Multidetector CT imaging of the abdomen and pelvis was performed
using the standard protocol following bolus administration of
intravenous contrast.
CONTRAST:  50mL OMNIPAQUE IOHEXOL 300 MG/ML SOLN, 100mL OMNIPAQUE
IOHEXOL 300 MG/ML SOLN

[Series 2: abd/pel with · axial · 0.66mm/px · z∈[+1089,+1514]mm · 12 of 97 slices shown, 14 images]
[im 8/97  soft-tissue]
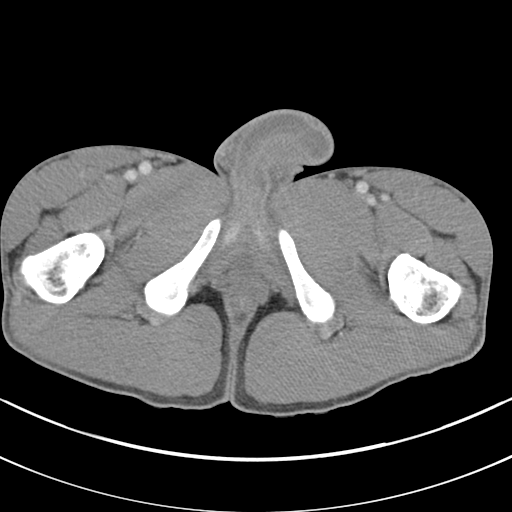
[im 8/97  bone]
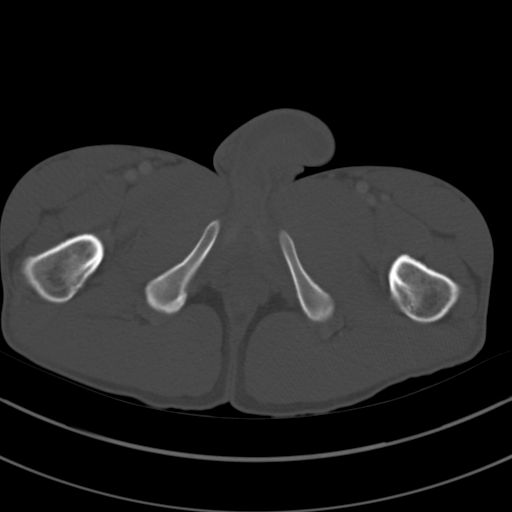
[im 16/97  soft-tissue]
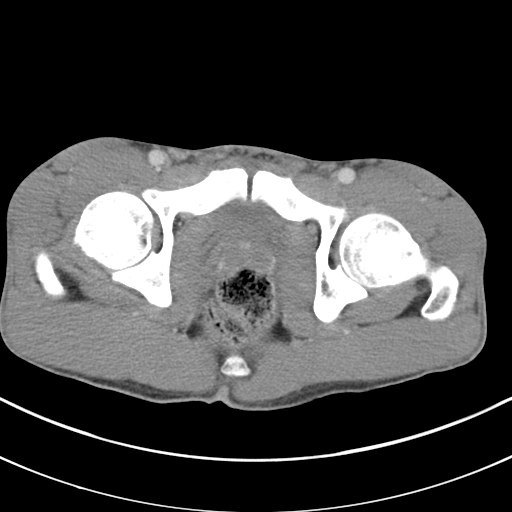
[im 24/97  soft-tissue]
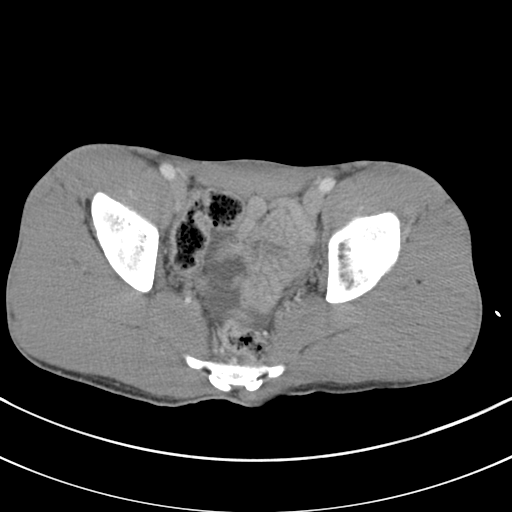
[im 31/97  soft-tissue]
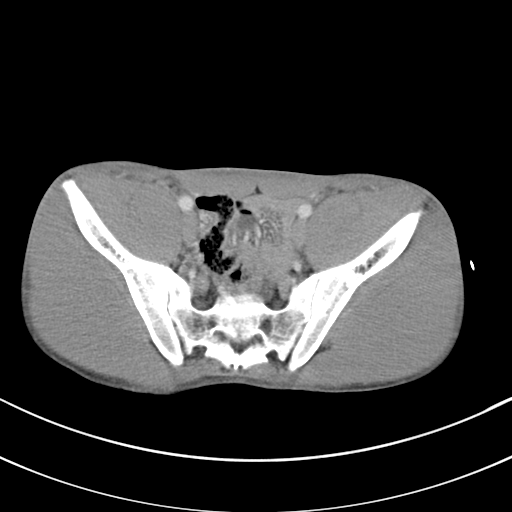
[im 39/97  soft-tissue]
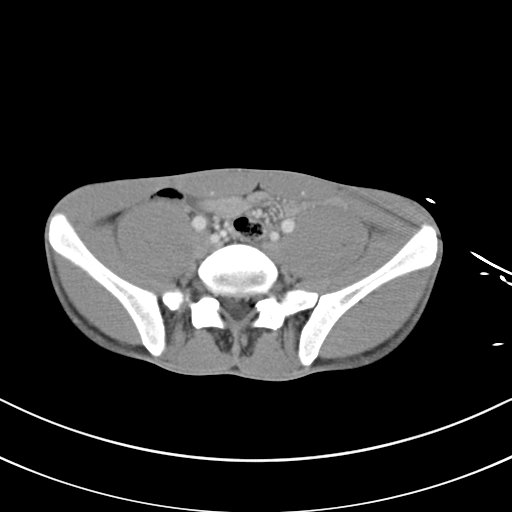
[im 47/97  soft-tissue]
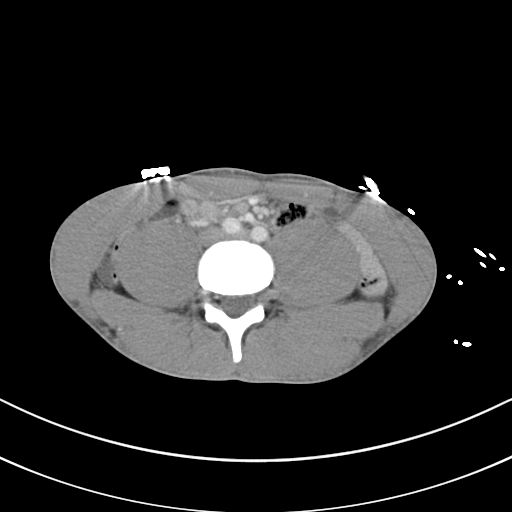
[im 54/97  soft-tissue]
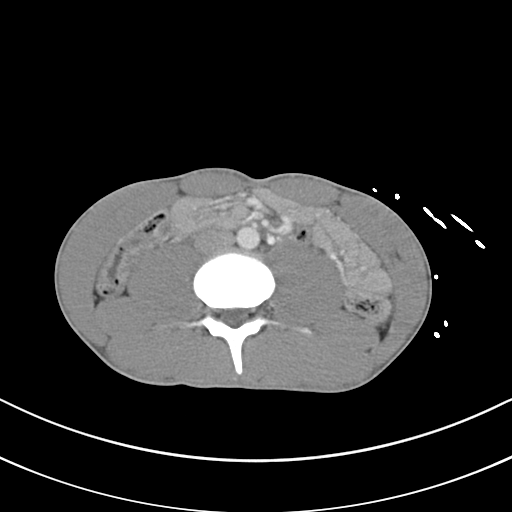
[im 62/97  soft-tissue]
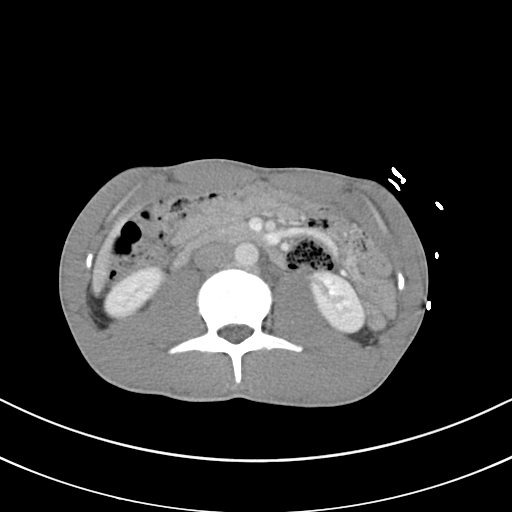
[im 70/97  soft-tissue]
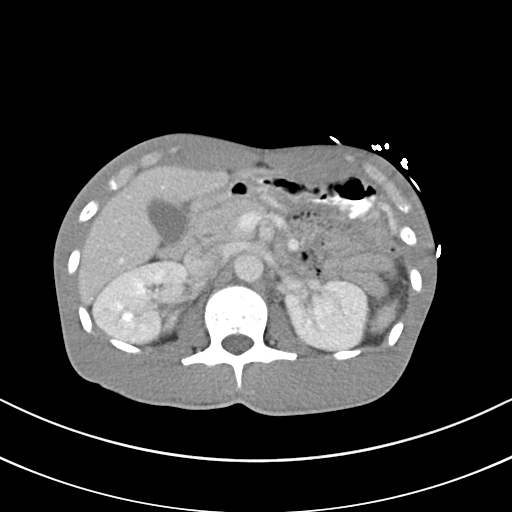
[im 70/97  bone]
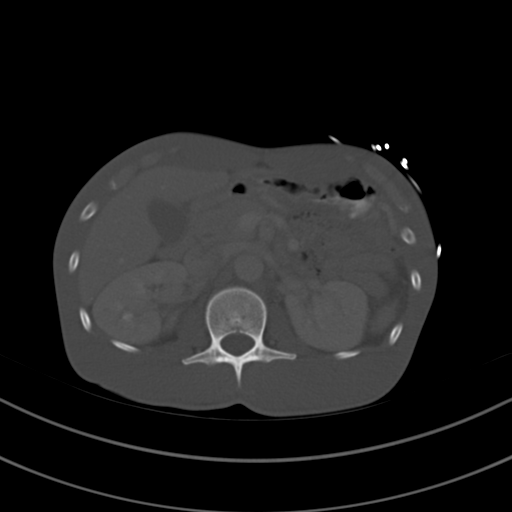
[im 77/97  soft-tissue]
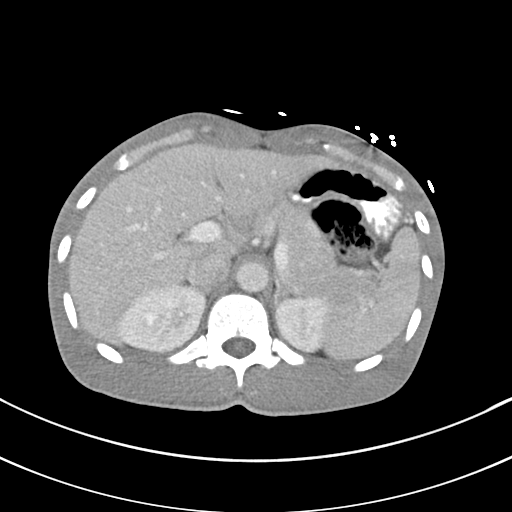
[im 85/97  soft-tissue]
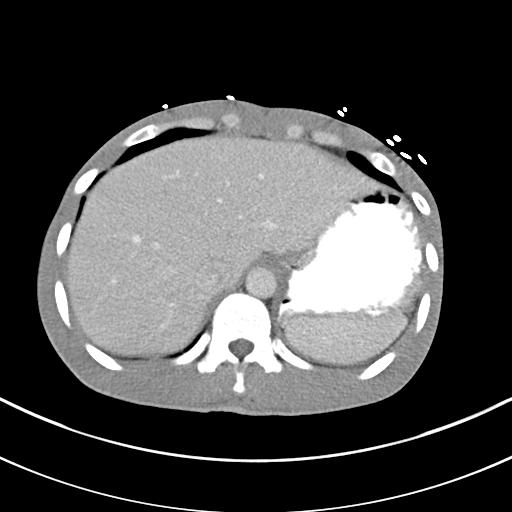
[im 93/97  soft-tissue]
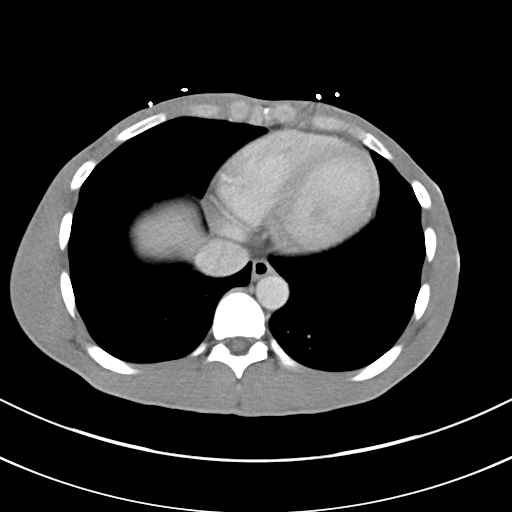

[Series 5: coronal a/|p · coronal · 0.60mm/px · 3 of 82 slices shown]
[im 37/82  soft-tissue]
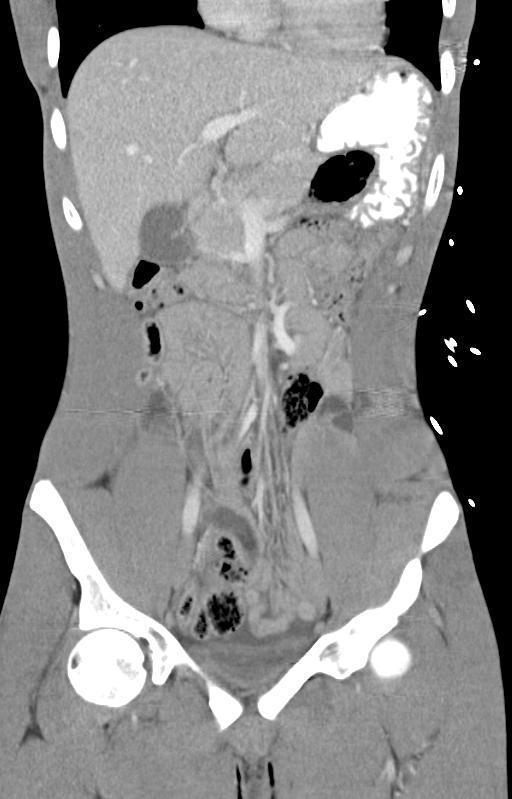
[im 46/82  soft-tissue]
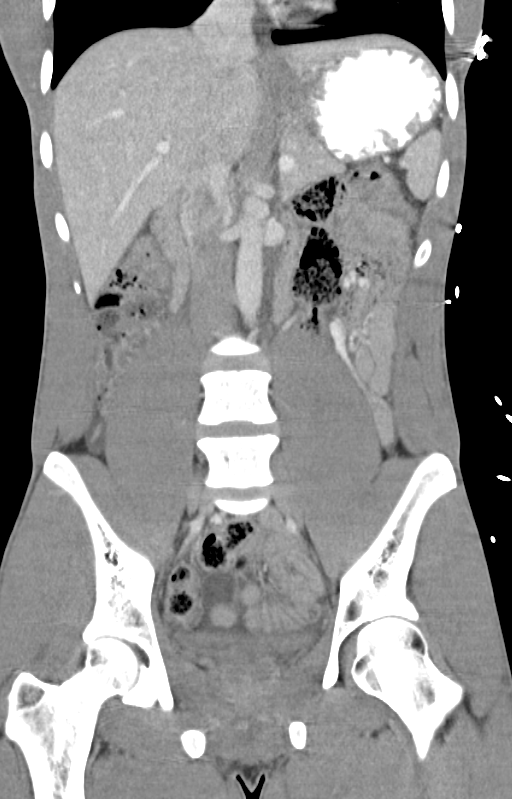
[im 55/82  soft-tissue]
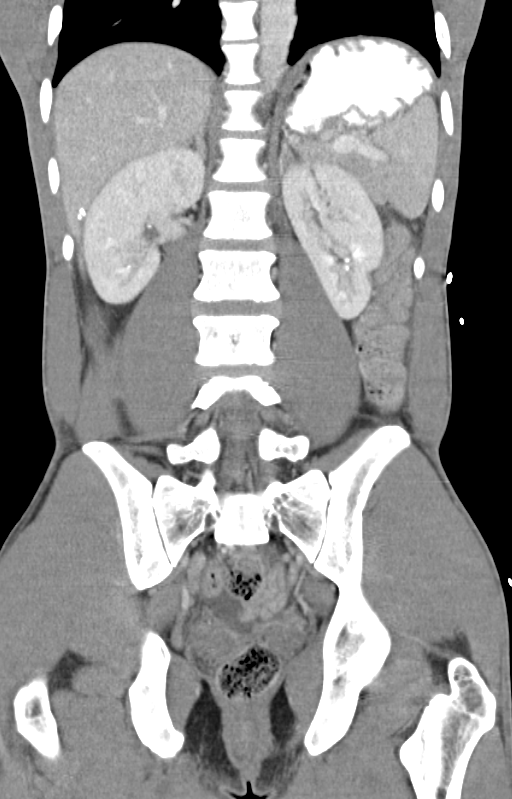

[15 of 46 positions shown; findings below may reference images not displayed]

FINDINGS: LUNG BASES: Included view of the lung bases are clear. Visualized
heart and pericardium are unremarkable.

SOLID ORGANS: The liver demonstrates subcentimeter granulomas in the
RIGHT lobe, otherwise unremarkable. Spleen, gallbladder, pancreas
and adrenal glands are unremarkable.

GASTROINTESTINAL TRACT: The stomach, small and large bowel are
normal in course and caliber without inflammatory changes. Oral
contrast in the stomach. Normal appendix.

KIDNEYS/ URINARY TRACT: Kidneys are orthotopic, demonstrating
symmetric enhancement. 4 mm upper pole calculus. Early excretion of
contrast limits assessment for small nonobstructing nephrolithiasis.
No hydronephrosis or solid renal masses. The unopacified ureters are
normal in course and caliber. Urinary bladder is partially distended
and unremarkable.

PERITONEUM/RETROPERITONEUM: Aortoiliac vessels are normal in course
and caliber. No lymphadenopathy by CT size criteria. Internal
reproductive organs are unremarkable. Small amount of low-density
ascites in the bilateral pericolic gutters extending into the
pelvis. No abscess. No intraperitoneal free air.

SOFT TISSUE/OSSEOUS STRUCTURES: Non-suspicious.
IMPRESSION: Small amount of nonspecific ascites.  Normal appendix.

Nonobstructing 4 mm RIGHT upper pole nephrolithiasis.

By: Mate Kenda

## 2016-11-20 ENCOUNTER — Encounter (HOSPITAL_COMMUNITY): Payer: Self-pay | Admitting: Nurse Practitioner

## 2016-11-20 ENCOUNTER — Emergency Department (HOSPITAL_COMMUNITY)
Admission: EM | Admit: 2016-11-20 | Discharge: 2016-11-20 | Disposition: A | Discharge status: Home / Self Care | Payer: Self-pay | Attending: Emergency Medicine | Admitting: Emergency Medicine

## 2016-11-20 ENCOUNTER — Emergency Department (HOSPITAL_COMMUNITY): Payer: Self-pay

## 2016-11-20 DIAGNOSIS — G43A0 Cyclical vomiting, not intractable: Secondary | ICD-10-CM | POA: Insufficient documentation

## 2016-11-20 DIAGNOSIS — R1115 Cyclical vomiting syndrome unrelated to migraine: Secondary | ICD-10-CM

## 2016-11-20 DIAGNOSIS — Z79899 Other long term (current) drug therapy: Secondary | ICD-10-CM | POA: Insufficient documentation

## 2016-11-20 DIAGNOSIS — F17290 Nicotine dependence, other tobacco product, uncomplicated: Secondary | ICD-10-CM | POA: Insufficient documentation

## 2016-11-20 LAB — COMPREHENSIVE METABOLIC PANEL
ALK PHOS: 55 U/L (ref 38–126)
ALT: 13 U/L — AB (ref 17–63)
AST: 26 U/L (ref 15–41)
Albumin: 4.7 g/dL (ref 3.5–5.0)
Anion gap: 11 (ref 5–15)
BUN: 13 mg/dL (ref 6–20)
CALCIUM: 9.4 mg/dL (ref 8.9–10.3)
CHLORIDE: 103 mmol/L (ref 101–111)
CO2: 22 mmol/L (ref 22–32)
CREATININE: 1.2 mg/dL (ref 0.61–1.24)
GFR calc Af Amer: >60 mL/min (ref >60)
GFR calc non Af Amer: >60 mL/min (ref >60)
GLUCOSE: 170 mg/dL — AB (ref 65–99)
Potassium: 3 mmol/L — ABNORMAL LOW (ref 3.5–5.1)
SODIUM: 136 mmol/L (ref 135–145)
Total Bilirubin: 0.8 mg/dL (ref 0.3–1.2)
Total Protein: 7.5 g/dL (ref 6.5–8.1)

## 2016-11-20 LAB — LIPASE, BLOOD: LIPASE: 28 U/L (ref 11–51)

## 2016-11-20 LAB — CBC
HCT: 41.5 % (ref 39.0–52.0)
Hemoglobin: 14.5 g/dL (ref 13.0–17.0)
MCH: 28.2 pg (ref 26.0–34.0)
MCHC: 34.9 g/dL (ref 30.0–36.0)
MCV: 80.6 fL (ref 78.0–100.0)
PLATELETS: 220 10*3/uL (ref 150–400)
RBC: 5.15 MIL/uL (ref 4.22–5.81)
RDW: 11.9 % (ref 11.5–15.5)
WBC: 6.9 10*3/uL (ref 4.0–10.5)

## 2016-11-20 MED ORDER — ONDANSETRON 4 MG PO TBDP: 4.0000 mg | ORAL_TABLET | Freq: Three times a day (TID) | ORAL | 0 refills | Status: DC | PRN

## 2016-11-20 MED ORDER — SODIUM CHLORIDE 0.9 % IV BOLUS (SEPSIS)
1000.0000 mL | Freq: Once | INTRAVENOUS | Status: AC
  Administered 2016-11-20: 1000 mL via INTRAVENOUS

## 2016-11-20 MED ORDER — ONDANSETRON HCL 4 MG/2ML IJ SOLN
4.0000 mg | Freq: Once | INTRAMUSCULAR | Status: AC
  Administered 2016-11-20: 4 mg via INTRAVENOUS
  Filled 2016-11-20: qty 2

## 2016-11-20 MED ORDER — HALOPERIDOL LACTATE 5 MG/ML IJ SOLN
2.0000 mg | Freq: Once | INTRAMUSCULAR | Status: DC
  Filled 2016-11-20: qty 1

## 2016-11-20 MED ORDER — HALOPERIDOL LACTATE 5 MG/ML IJ SOLN
2.0000 mg | Freq: Once | INTRAMUSCULAR | Status: AC
  Administered 2016-11-20: 2 mg via INTRAVENOUS
  Filled 2016-11-20: qty 1

## 2016-11-20 MED ORDER — CAPSAICIN 0.025 % EX CREA: TOPICAL_CREAM | Freq: Two times a day (BID) | CUTANEOUS | 0 refills | Status: AC | PRN

## 2016-11-20 NOTE — Discharge Instructions (Signed)
Zofran as needed for nausea. Apply topical cream to stomach for abdominal pain.  Please follow up with your primary care physician for discussion of today's diagnosis.  Please seek immediate care if you develop any of the following symptoms: The pain does not go away.  You have a fever.  You keep throwing up (vomiting).  You pass bloody or black tarry stools.  There is bright red blood in the stool.  There is rectal pain.  You do not seem to be getting better.  You have any questions or concerns.

## 2016-11-20 NOTE — ED Triage Notes (Signed)
Pt c/o severe abdominal pain and cyclical vomiting. Endorses smoking marijuana prior to episode onset and hx of the same.

## 2016-11-20 NOTE — ED Notes (Signed)
Pt unable to urinate at this time, urinal by bedside

## 2016-11-20 NOTE — ED Notes (Signed)
Pt unable to urinate at this time. Urinal by bedside. 

## 2016-11-20 NOTE — ED Provider Notes (Signed)
[facility]-EMERGENCY DEPT Provider Note   CSN: [id number] Arrival date & time: 11/20/16  0021  By signing my name below, I, Garrett Martinez, attest that this documentation has been prepared under the direction and in the presence of [facility] LLCJaime Tarl Cephas, PA-C. Electronically Signed: Alyssa GroveMartin Martinez, ED Scribe. 11/20/16. 12:52 AM.  History   Chief Complaint Chief Complaint  Patient presents with  . Emesis  . Abdominal Pain   The history is provided by the patient and a relative. No language interpreter was used.   HPI Comments: Garrett Martinez is a 26 y.o. male who presents to the Emergency Department complaining of gradual onset, progressively worsening, intermittent, severe epigastric abdominal pain since last week. Pt has been fine for most of the day, but around 9 PM, pain started to progressively worsening. Pt reports associated diarrhea, vomiting and nausea. Pt has vomited about 8x today. Abdominal pain and vomiting feels similar to prior episodes that may have been caused by smoking marijuana. Pt does not smoke cigarettes anymore, but does smoke marijuana and in the past 3 days he has smoked about 2 grams. Denies fever, chills, bloody stools.   Past Medical History:  Diagnosis Date  . Tobacco abuse     Patient Active Problem List   Diagnosis Date Noted  . Nausea & vomiting 03/10/2015  . SIRS (systemic inflammatory response syndrome) (HCC) 03/10/2015  . Tobacco abuse 03/10/2015    History reviewed. No pertinent surgical history.   Home Medications    Prior to Admission medications   Medication Sig Start Date End Date Taking? Authorizing Provider  ibuprofen (ADVIL,MOTRIN) 200 MG tablet Take 200 mg by mouth every 6 (six) hours as needed for mild pain or moderate pain.    Historical Provider, MD  ondansetron (ZOFRAN) 4 MG tablet Take 1 tablet (4 mg total) by mouth every 6 (six) hours. 03/20/16   Bethel BornKelly Marie Gekas, PA-C    Family History Family History  Problem Relation Age of Onset  . Lumbar  disc disease Mother     Chronic low back pain    Social History Social History  Substance Use Topics  . Smoking status: Current Every Day Smoker    Types: Cigars  . Smokeless tobacco: Never Used  . Alcohol use Yes     Comment: "light social drinking"     Allergies   Patient has no known allergies.   Review of Systems Review of Systems  Constitutional: Negative for fever.  Gastrointestinal: Positive for abdominal pain, diarrhea, nausea and vomiting.  All other systems reviewed and are negative.  Physical Exam Updated Vital Signs BP 111/64 (BP Location: Left Arm)   Pulse 93   Temp 97.7 F (36.5 C) (Oral)   Resp 20   SpO2 100%   Physical Exam  Constitutional: He is oriented to person, place, and time. He appears well-developed and well-nourished. No distress.  HENT:  Head: Normocephalic and atraumatic.  Cardiovascular: Normal rate, regular rhythm and normal heart sounds.   No murmur heard. Pulmonary/Chest: Effort normal and breath sounds normal. No respiratory distress. He has no wheezes. He has no rales.  Abdominal: Soft. Bowel sounds are normal. He exhibits no distension and no mass. There is tenderness (Generalized). There is no guarding.  Musculoskeletal: He exhibits no edema.  Neurological: He is alert and oriented to person, place, and time.  Skin: Skin is warm and dry.  Nursing note and vitals reviewed.  ED Treatments / Results  DIAGNOSTIC STUDIES: Oxygen Saturation is 100% on RA, normal by my  interpretation.    COORDINATION OF CARE: 12:44 AM Discussed treatment plan with pt at bedside which includes Zofran and IV fluids and pt agreed to plan.  Labs (all labs ordered are listed, but only abnormal results are displayed) Labs Reviewed - No data to display  EKG  EKG Interpretation None       Radiology No results found.  Procedures Procedures (including critical care time)  Medications Ordered in ED Medications - No data to display   Initial  Impression / Assessment and Plan / ED Course  I have reviewed the triage vital signs and the nursing notes.  Pertinent labs & imaging results that were available during my care of the patient were reviewed by me and considered in my medical decision making (see chart for details).  Clinical Course    Garrett Martinez is a 26 y.o. male who presents to ED for generalized abdominal pain, n/v/d that began today. On exam, patient is afebrile with generalized abdominal tenderness. Non-surgical abdomen. Patient has hx of cyclical vomiting and states that this feels the same. Fluids and nausea meds given. Patient re-evaluated, feels improved but still complaining of abdominal pain. No emesis since zofran administration. Haldol given.   Patient re-evaluated. Still no emesis. Tolerating PO. Patient feels comfortable with discharge to home. Reasons to return to ER were discussed. Will give rx for zofran and capsaicin cream. Smoking cessation discussed. All questions answered.   Patient discussed with Dr. Wilkie Aye who agrees with treatment plan.     Final Clinical Impressions(s) / ED Diagnoses   Final diagnoses:  None    New Prescriptions New Prescriptions   No medications on file   I personally performed the services described in this documentation, which was scribed in my presence. The recorded information has been reviewed and is accurate.    [facility] Garrett Hane, PA-C 11/20/16 4098    Garrett Baton, MD 11/21/16 [phone]

## 2018-03-24 ENCOUNTER — Other Ambulatory Visit: Payer: Self-pay

## 2018-03-24 ENCOUNTER — Emergency Department (HOSPITAL_COMMUNITY)
Admission: EM | Admit: 2018-03-24 | Discharge: 2018-03-24 | Disposition: A | Discharge status: Home / Self Care | Payer: Self-pay | Attending: Emergency Medicine | Admitting: Emergency Medicine

## 2018-03-24 DIAGNOSIS — Z5321 Procedure and treatment not carried out due to patient leaving prior to being seen by health care provider: Secondary | ICD-10-CM | POA: Insufficient documentation

## 2018-03-24 DIAGNOSIS — R109 Unspecified abdominal pain: Secondary | ICD-10-CM | POA: Insufficient documentation

## 2018-03-24 DIAGNOSIS — R197 Diarrhea, unspecified: Secondary | ICD-10-CM | POA: Insufficient documentation

## 2018-03-24 DIAGNOSIS — R112 Nausea with vomiting, unspecified: Secondary | ICD-10-CM | POA: Insufficient documentation

## 2018-03-24 MED ORDER — ONDANSETRON 8 MG PO TBDP
8.0000 mg | ORAL_TABLET | Freq: Once | ORAL | Status: DC
  Filled 2018-03-24: qty 1

## 2018-03-24 NOTE — ED Notes (Signed)
I have just been informed pt. Is at [facility]. Will d/c now.

## 2018-03-24 NOTE — ED Notes (Signed)
Pt made an unsuccessful attempt to provide a urine specimen. 

## 2018-03-24 NOTE — ED Notes (Signed)
pts family member stated "the pt was going to their pcp, did not want to wait." pt left facility.

## 2018-03-24 NOTE — ED Triage Notes (Signed)
Pt reports smoking a cuban cigar earlier today and having N/V/D afterwards. Pt reports having similar episodes in the past. Pt states that he also at [place] shortly before sx started.

## 2019-04-29 ENCOUNTER — Emergency Department (HOSPITAL_COMMUNITY)
Admission: EM | Admit: 2019-04-29 | Discharge: 2019-04-29 | Disposition: A | Discharge status: Home / Self Care | Payer: Self-pay | Attending: Emergency Medicine | Admitting: Emergency Medicine

## 2019-04-29 ENCOUNTER — Encounter (HOSPITAL_COMMUNITY): Payer: Self-pay | Admitting: Emergency Medicine

## 2019-04-29 ENCOUNTER — Emergency Department (HOSPITAL_COMMUNITY): Payer: Self-pay

## 2019-04-29 ENCOUNTER — Other Ambulatory Visit: Payer: Self-pay

## 2019-04-29 DIAGNOSIS — R1013 Epigastric pain: Secondary | ICD-10-CM | POA: Insufficient documentation

## 2019-04-29 DIAGNOSIS — R112 Nausea with vomiting, unspecified: Secondary | ICD-10-CM | POA: Insufficient documentation

## 2019-04-29 DIAGNOSIS — R197 Diarrhea, unspecified: Secondary | ICD-10-CM | POA: Insufficient documentation

## 2019-04-29 DIAGNOSIS — F17290 Nicotine dependence, other tobacco product, uncomplicated: Secondary | ICD-10-CM | POA: Insufficient documentation

## 2019-04-29 LAB — COMPREHENSIVE METABOLIC PANEL
ALT: 15 U/L (ref 0–44)
AST: 25 U/L (ref 15–41)
Albumin: 5 g/dL (ref 3.5–5.0)
Alkaline Phosphatase: 44 U/L (ref 38–126)
Anion gap: 15 (ref 5–15)
BUN: 13 mg/dL (ref 6–20)
CO2: 23 mmol/L (ref 22–32)
Calcium: 9.8 mg/dL (ref 8.9–10.3)
Chloride: 102 mmol/L (ref 98–111)
Creatinine, Ser: 1.09 mg/dL (ref 0.61–1.24)
GFR calc Af Amer: >60 mL/min (ref >60)
GFR calc non Af Amer: >60 mL/min (ref >60)
Glucose, Bld: 144 mg/dL — ABNORMAL HIGH (ref 70–99)
Potassium: 3.5 mmol/L (ref 3.5–5.1)
Sodium: 140 mmol/L (ref 135–145)
Total Bilirubin: 0.9 mg/dL (ref 0.3–1.2)
Total Protein: 8.2 g/dL — ABNORMAL HIGH (ref 6.5–8.1)

## 2019-04-29 LAB — CBC
HCT: 45.2 % (ref 39.0–52.0)
Hemoglobin: 14.8 g/dL (ref 13.0–17.0)
MCH: 28.3 pg (ref 26.0–34.0)
MCHC: 32.7 g/dL (ref 30.0–36.0)
MCV: 86.4 fL (ref 80.0–100.0)
Platelets: 187 10*3/uL (ref 150–400)
RBC: 5.23 MIL/uL (ref 4.22–5.81)
RDW: 11.9 % (ref 11.5–15.5)
WBC: 10.3 10*3/uL (ref 4.0–10.5)
nRBC: 0 % (ref 0.0–0.2)

## 2019-04-29 LAB — URINALYSIS, ROUTINE W REFLEX MICROSCOPIC
Bilirubin Urine: NEGATIVE
Glucose, UA: NEGATIVE mg/dL
Hgb urine dipstick: NEGATIVE
Ketones, ur: 20 mg/dL — AB
Leukocytes,Ua: NEGATIVE
Nitrite: NEGATIVE
Protein, ur: 30 mg/dL — AB
Specific Gravity, Urine: 1.031 — ABNORMAL HIGH (ref 1.005–1.030)
pH: 7 (ref 5.0–8.0)

## 2019-04-29 LAB — LIPASE, BLOOD: Lipase: 24 U/L (ref 11–51)

## 2019-04-29 MED ORDER — PANTOPRAZOLE SODIUM 20 MG PO TBEC: 20.0000 mg | DELAYED_RELEASE_TABLET | Freq: Every day | ORAL | 0 refills | Status: AC

## 2019-04-29 MED ORDER — SODIUM CHLORIDE 0.9 % IV BOLUS
1000.0000 mL | Freq: Once | INTRAVENOUS | Status: AC
  Administered 2019-04-29: 1000 mL via INTRAVENOUS

## 2019-04-29 MED ORDER — ONDANSETRON 4 MG PO TBDP: 4.0000 mg | ORAL_TABLET | Freq: Three times a day (TID) | ORAL | 0 refills | Status: AC | PRN

## 2019-04-29 MED ORDER — HALOPERIDOL LACTATE 5 MG/ML IJ SOLN
5.0000 mg | Freq: Once | INTRAMUSCULAR | Status: AC
  Administered 2019-04-29: 5 mg via INTRAVENOUS
  Filled 2019-04-29: qty 1

## 2019-04-29 MED ORDER — CAPSAICIN 0.025 % EX CREA
TOPICAL_CREAM | Freq: Once | CUTANEOUS | Status: AC
  Administered 2019-04-29: 10:00:00 via TOPICAL
  Filled 2019-04-29: qty 60

## 2019-04-29 MED ORDER — ONDANSETRON HCL 4 MG/2ML IJ SOLN
4.0000 mg | Freq: Once | INTRAMUSCULAR | Status: AC
  Administered 2019-04-29: 4 mg via INTRAVENOUS
  Filled 2019-04-29: qty 2

## 2019-04-29 MED ORDER — FAMOTIDINE 20 MG PO TABS: 20.0000 mg | ORAL_TABLET | Freq: Two times a day (BID) | ORAL | 0 refills | Status: AC

## 2019-04-29 NOTE — ED Notes (Signed)
Pt says water is making him more nauseous, ginger ale given to pt.

## 2019-04-29 NOTE — ED Notes (Signed)
ED Provider at bedside. 

## 2019-04-29 NOTE — ED Notes (Signed)
Ultrasound bedside.

## 2019-04-29 NOTE — ED Triage Notes (Signed)
Pt c/o abd pains with n/v/d since yesterday afternoon.

## 2019-04-29 NOTE — Discharge Instructions (Addendum)
°  Diet: Start with a clear liquid diet, progressed to a full liquid diet, and then bland solids as you are able. Please adhere to the enclosed dietary suggestions.  In general, avoid NSAIDs (i.e. ibuprofen, naproxen, etc.), caffeine, alcohol, spicy foods, fatty foods, or any other foods that seem to cause your symptoms to arise.  THC hyperemesis is also a possibility for your symptoms.  Please refrain from marijuana use for several weeks to see if this prevents recurrence of symptoms.  Protonix: Take this medication daily, 20-30 minutes prior to your first meal, for the next 8 weeks.  Continue to take this medication even if you begin to feel better.  Pepcid: Take this medication twice a day for the next 5 days.  Nausea/vomiting: Use the ondansetron (generic for Zofran) for nausea or vomiting.  This medication may not prevent all vomiting or nausea, but can help facilitate better hydration. Things that can help with nausea/vomiting also include peppermint/menthol candies, vitamin B12, and ginger. Capsaicin: May apply the capsaicin to the upper abdomen twice a day, as needed, for abdominal discomfort and nausea/vomiting associated with THC hyperemesis syndrome.  Follow-up: Please follow-up with your primary care provider on this matter.  Return: Return to the ED for significantly worsening symptoms, persistent vomiting, persistent fever, vomiting blood, blood in the stools, dark stools, or any other major concerns.  For prescription assistance, may try using prescription discount sites or apps, such as goodrx.com

## 2019-04-29 NOTE — ED Provider Notes (Addendum)
[facility]-EMERGENCY DEPT Provider Note   CSN: [id number] Arrival date & time: 04/29/19  0735    History   Chief Complaint Chief Complaint  Patient presents with  . Abdominal Pain  . Emesis  . Diarrhea    HPI Garrett Martinez is a 28 y.o. male.     HPI    Garrett Martinez is a 28 y.o. male, with a history of marijuana use, presenting to the ED with abdominal pain beginning yesterday evening. Pain is epigastric, gives a vague description, waxing and waning, 7/10 currently, radiating throughout the abdomen. Accompanied by nausea and multiple episodes of nonbloody, nonbilious emesis and a couple episodes of diarrhea. He has had these symptoms before, but states they typically resolve their own.  He drinks alcohol on the weekends, denies alcohol use yesterday. Denies NSAID use or illicit drug use other than marijuana. He did use marijuana prior to symptom onset. Vomiting began before abdominal pain.  Denies fever/chills, hematochezia/melena, cough, shortness of breath, chest pain, urinary symptoms, flank/back pain, genital pain/swelling, or any other complaints.     Past Medical History:  Diagnosis Date  . Tobacco abuse     Patient Active Problem List   Diagnosis Date Noted  . Nausea & vomiting 03/10/2015  . SIRS (systemic inflammatory response syndrome) (HCC) 03/10/2015  . Tobacco abuse 03/10/2015    History reviewed. No pertinent surgical history.      Home Medications    Prior to Admission medications   Medication Sig Start Date End Date Taking? Authorizing Provider  capsaicin (ZOSTRIX) 0.025 % cream Apply topically 2 (two) times daily as needed. Patient not taking: Reported on 04/29/2019 11/20/16   Ward, Chase PicketJaime Pilcher, PA-C  famotidine (PEPCID) 20 MG tablet Take 1 tablet (20 mg total) by mouth 2 (two) times daily for 5 days. 04/29/19 05/04/19  Joy, Shawn C, PA-C  ondansetron (ZOFRAN ODT) 4 MG disintegrating tablet Take 1 tablet (4 mg total) by mouth  every 8 (eight) hours as needed for nausea or vomiting. 04/29/19   Joy, Shawn C, PA-C  pantoprazole (PROTONIX) 20 MG tablet Take 1 tablet (20 mg total) by mouth daily. 04/29/19   Joy, Hillard DankerShawn C, PA-C    Family History Family History  Problem Relation Age of Onset  . Lumbar disc disease Mother        Chronic low back pain    Social History Social History   Tobacco Use  . Smoking status: Current Every Day Smoker    Types: Cigars  . Smokeless tobacco: Never Used  Substance Use Topics  . Alcohol use: Yes  . Drug use: Yes    Types: Marijuana     Allergies   Patient has no known allergies.   Review of Systems Review of Systems  Constitutional: Negative for chills, diaphoresis and fever.  Respiratory: Negative for cough and shortness of breath.   Cardiovascular: Negative for chest pain.  Gastrointestinal: Positive for abdominal pain, diarrhea, nausea and vomiting. Negative for blood in stool.  Genitourinary: Negative for discharge, dysuria, flank pain, frequency, hematuria, penile pain, scrotal swelling and testicular pain.  Musculoskeletal: Negative for back pain.  Neurological: Negative for dizziness, syncope, light-headedness and headaches.  All other systems reviewed and are negative.    Physical Exam Updated Vital Signs BP 106/66 (BP Location: Right Arm)   Pulse 66   Temp 98.7 F (37.1 C) (Oral)   Resp 16   SpO2 99%   Physical Exam Vitals signs and nursing note reviewed.  Constitutional:  General: He is not in acute distress.    Appearance: He is well-developed. He is not diaphoretic.  HENT:     Head: Normocephalic and atraumatic.     Mouth/Throat:     Mouth: Mucous membranes are moist.     Pharynx: Oropharynx is clear.  Eyes:     Conjunctiva/sclera: Conjunctivae normal.  Neck:     Musculoskeletal: Neck supple.  Cardiovascular:     Rate and Rhythm: Normal rate and regular rhythm.     Pulses: Normal pulses.          Radial pulses are 2+ on the right  side and 2+ on the left side.       Posterior tibial pulses are 2+ on the right side and 2+ on the left side.     Heart sounds: Normal heart sounds.     Comments: Tactile temperature in the extremities appropriate and equal bilaterally. Pulmonary:     Effort: Pulmonary effort is normal. No respiratory distress.     Breath sounds: Normal breath sounds.  Abdominal:     Palpations: Abdomen is soft.     Tenderness: There is abdominal tenderness in the right upper quadrant and epigastric area. There is no right CVA tenderness, left CVA tenderness or guarding.  Musculoskeletal:     Right lower leg: No edema.     Left lower leg: No edema.  Lymphadenopathy:     Cervical: No cervical adenopathy.  Skin:    General: Skin is warm and dry.  Neurological:     Mental Status: He is alert.  Psychiatric:        Mood and Affect: Mood and affect normal.        Speech: Speech normal.        Behavior: Behavior normal.      ED Treatments / Results  Labs (all labs ordered are listed, but only abnormal results are displayed) Labs Reviewed  COMPREHENSIVE METABOLIC PANEL - Abnormal; Notable for the following components:      Result Value   Glucose, Bld 144 (*)    Total Protein 8.2 (*)    All other components within normal limits  URINALYSIS, ROUTINE W REFLEX MICROSCOPIC - Abnormal; Notable for the following components:   Specific Gravity, Urine 1.031 (*)    Ketones, ur 20 (*)    Protein, ur 30 (*)    Bacteria, UA RARE (*)    All other components within normal limits  LIPASE, BLOOD  CBC    EKG None  Radiology US Abdomen Limited Ruq  Result Date: 04/29/2019 CLINICAL DATA:  Right upper quadrant pain. EXAM: ULTRASOUND ABDOMEN LIMITED RIGHT UPPER QUADRANT COMPARISON:  No prior. FINDINGS: Gallbladder: No gallstones or wall thickening visualized. No sonographic Murphy sign noted by sonographer. Common bile duct: Diameter: 4 mm Liver: No focal lesion identified. Within normal limits in parenchymal  echogenicity. Portal vein is patent on color Doppler imaging with normal direction of blood flow towards the liver. IMPRESSION: No acute or focal abnormality. Electronically Signed   By: Marcello Moores  Register   On: 04/29/2019 12:01    Procedures Procedures (including critical care time)  Medications Ordered in ED Medications  sodium chloride 0.9 % bolus 1,000 mL (0 mLs Intravenous Stopped 04/29/19 1232)  capsaicin (ZOSTRIX) 0.025 % cream ( Topical Given 04/29/19 1012)  ondansetron (ZOFRAN) injection 4 mg (4 mg Intravenous Given 04/29/19 0933)  sodium chloride 0.9 % bolus 1,000 mL (0 mLs Intravenous Stopped 04/29/19 1520)  haloperidol lactate (HALDOL) injection 5 mg (5  mg Intravenous Given 04/29/19 1306)     Initial Impression / Assessment and Plan / ED Course  I have reviewed the triage vital signs and the nursing notes.  Pertinent labs & imaging results that were available during my care of the patient were reviewed by me and considered in my medical decision making (see chart for details).  Clinical Course as of Apr 28 1624  Mon Apr 29, 2019  1235 Patient states he feels much better.  His pain has resolved.  He has not had any further vomiting.  Repeat abdominal exam benign.   [SJ]  1258 Patient tried drinking some water, but vomited again.    [SJ]    Clinical Course User Index [SJ] Joy, Shawn C, PA-C       Patient presents with nausea, vomiting, and epigastric discomfort. Patient is nontoxic appearing, afebrile, not tachycardic, not tachypneic, not hypotensive, maintains excellent SPO2 on room air. Ketonuria may indicate dehydration.  Lab work otherwise reassuring.  RUQ ultrasound without acute abnormality. THC hyperemesis syndrome is possible explanation for the patient's symptoms.  Patient states this may fit with recurrence of his symptoms. Patient was counseled on this matter.   He was ultimately able to tolerate oral fluids prior to discharge. The patient was given instructions for  home care as well as return precautions. Patient voices understanding of these instructions, accepts the plan, and is comfortable with discharge.  Vitals:   04/29/19 1030 04/29/19 1118 04/29/19 1200 04/29/19 1230  BP: (!) 103/59 100/60 104/61 114/81  Pulse: 69 70 68 92  Resp: 18 18 18 18   Temp:      TempSrc:      SpO2: 97% 100% 98% 99%   Vitals:   04/29/19 1330 04/29/19 1400 04/29/19 1430 04/29/19 1500  BP: 125/77 112/83 112/86 118/82  Pulse: 64 68 78 72  Resp: 18  18 18   Temp:      TempSrc:      SpO2: 99% 97% 96% 96%     Final Clinical Impressions(s) / ED Diagnoses   Final diagnoses:  Epigastric pain  Nausea vomiting and diarrhea    ED Discharge Orders         Ordered    ondansetron (ZOFRAN ODT) 4 MG disintegrating tablet  Every 8 hours PRN     04/29/19 1242    pantoprazole (PROTONIX) 20 MG tablet  Daily     04/29/19 1242    famotidine (PEPCID) 20 MG tablet  2 times daily     04/29/19 1242           Anselm PancoastJoy, Shawn C, PA-C 04/29/19 1252    Anselm PancoastJoy, Shawn C, PA-C 04/29/19 1625    Samuel JesterMcManus, Kathleen, DO 05/02/19 1118

## 2019-04-29 NOTE — ED Notes (Signed)
Pt given urinal and asked for urine sample 

## 2019-04-29 NOTE — ED Notes (Signed)
Pt informed of need for urine specimen  

## 2019-04-30 ENCOUNTER — Encounter (HOSPITAL_COMMUNITY): Payer: Self-pay

## 2019-04-30 ENCOUNTER — Other Ambulatory Visit: Payer: Self-pay

## 2019-04-30 ENCOUNTER — Emergency Department (HOSPITAL_COMMUNITY)
Admission: EM | Admit: 2019-04-30 | Discharge: 2019-05-01 | Disposition: A | Discharge status: Home / Self Care | Payer: Self-pay | Attending: Emergency Medicine | Admitting: Emergency Medicine

## 2019-04-30 DIAGNOSIS — Z5321 Procedure and treatment not carried out due to patient leaving prior to being seen by health care provider: Secondary | ICD-10-CM | POA: Insufficient documentation

## 2019-04-30 DIAGNOSIS — R531 Weakness: Secondary | ICD-10-CM | POA: Insufficient documentation

## 2019-04-30 NOTE — ED Notes (Signed)
Bed: WTR5 Expected date:  Expected time:  Means of arrival:  Comments: 

## 2019-04-30 NOTE — ED Triage Notes (Addendum)
Patient c/o weakness, no energy, and vomiting.  8 occurrences of emesis Denies diarrhea  Denies abdominal pain   Patient states his mom checked his VS at home said his BP was low.   Patient seen here yesterday for same.   BP in ED-129/86   A/ox4

## 2024-08-03 ENCOUNTER — Other Ambulatory Visit: Payer: Self-pay

## 2024-08-03 ENCOUNTER — Encounter (HOSPITAL_COMMUNITY): Payer: Self-pay

## 2024-08-03 ENCOUNTER — Emergency Department (HOSPITAL_COMMUNITY)
Admission: EM | Admit: 2024-08-03 | Discharge: 2024-08-03 | Disposition: A | Discharge status: Home / Self Care | Payer: Self-pay

## 2024-08-03 DIAGNOSIS — R0981 Nasal congestion: Secondary | ICD-10-CM | POA: Insufficient documentation

## 2024-08-03 DIAGNOSIS — F17200 Nicotine dependence, unspecified, uncomplicated: Secondary | ICD-10-CM | POA: Insufficient documentation

## 2024-08-03 DIAGNOSIS — R058 Other specified cough: Secondary | ICD-10-CM | POA: Insufficient documentation

## 2024-08-03 LAB — RESP PANEL BY RT-PCR (RSV, FLU A&B, COVID)  RVPGX2
Influenza A by PCR: NEGATIVE
Influenza B by PCR: NEGATIVE
Resp Syncytial Virus by PCR: NEGATIVE
SARS Coronavirus 2 by RT PCR: NEGATIVE

## 2024-08-03 MED ORDER — DM-GUAIFENESIN ER 30-600 MG PO TB12: 1.0000 | ORAL_TABLET | Freq: Two times a day (BID) | ORAL | 0 refills | Status: AC

## 2024-08-03 NOTE — ED Triage Notes (Signed)
 Patient said he has had a runny nose, congestion and watery eyes, cough for 3 days.

## 2024-08-03 NOTE — ED Provider Notes (Signed)
 [place] EMERGENCY DEPARTMENT AT [facility] Provider Note   CSN: [id number] Arrival date & time: 08/03/24  1610     Patient presents with: Nasal Congestion   Garrett Martinez is a 33 y.o. male with past medical history of tobacco abuse presents Emergency Department for evaluation of nasal congestion, productive cough for past 3 days.  Has been drinking adequate amount of water.  Has tried ginger, tea, Benadryl with some relief.  No fever, sick contacts, chest pain, shortness of breath, NVD.    HPI     Prior to Admission medications   Medication Sig Start Date End Date Taking? Authorizing Provider  dextromethorphan-guaiFENesin (MUCINEX DM) 30-600 MG 12hr tablet Take 1 tablet by mouth 2 (two) times daily for 10 days. 08/03/24 08/13/24 Yes Minnie Tinnie BRAVO, PA  capsaicin  (ZOSTRIX) 0.025 % cream Apply topically 2 (two) times daily as needed. Patient not taking: Reported on 04/29/2019 11/20/16   Ward, Ami Copes, PA-C  famotidine  (PEPCID ) 20 MG tablet Take 1 tablet (20 mg total) by mouth 2 (two) times daily for 5 days. 04/29/19 05/04/19  Joy, Shawn C, PA-C  ondansetron  (ZOFRAN  ODT) 4 MG disintegrating tablet Take 1 tablet (4 mg total) by mouth every 8 (eight) hours as needed for nausea or vomiting. 04/29/19   Joy, Shawn C, PA-C  pantoprazole  (PROTONIX ) 20 MG tablet Take 1 tablet (20 mg total) by mouth daily. 04/29/19   Joy, Shawn C, PA-C    Allergies: Patient has no known allergies.    Review of Systems  HENT:  Positive for congestion.   Respiratory:  Positive for cough.     Updated Vital Signs BP 120/74 (BP Location: Left Arm)   Pulse 86   Temp 98.2 F (36.8 C) (Oral)   Resp 16   Ht 5' 10 (1.778 m)   Wt 68.5 kg   SpO2 98%   BMI 21.67 kg/m   Physical Exam Vitals and nursing note reviewed.  Constitutional:      General: He is not in acute distress.    Appearance: Normal appearance. He is not ill-appearing.  HENT:     Head: Normocephalic and atraumatic.     Nose:  Congestion present.     Mouth/Throat:     Mouth: Mucous membranes are moist.     Pharynx: Uvula midline.     Tonsils: No tonsillar exudate or tonsillar abscesses.  Eyes:     Conjunctiva/sclera: Conjunctivae normal.  Cardiovascular:     Rate and Rhythm: Normal rate.  Pulmonary:     Effort: Pulmonary effort is normal. No respiratory distress.     Breath sounds: Normal breath sounds.     Comments: Speaking in full complete sentences without difficulty.  Maintain oxygen saturation without supplementation. Musculoskeletal:     Cervical back: Normal range of motion and neck supple. No rigidity.     Right lower leg: No edema.     Left lower leg: No edema.  Skin:    Coloration: Skin is not jaundiced or pale.  Neurological:     Mental Status: He is alert and oriented to person, place, and time. Mental status is at baseline.     (all labs ordered are listed, but only abnormal results are displayed) Labs Reviewed  RESP PANEL BY RT-PCR (RSV, FLU A&B, COVID)  [id number]    EKG: None  Radiology: No results found.   Medications Ordered in the ED - No data to display  Medical Decision Making Risk OTC drugs.   Patient presents to the ED for concern of cough, congestion, this involves an extensive number of treatment options, and is a complaint that carries with it a high risk of complications and morbidity.  The differential diagnosis includes COVID, flu, RSV, sinusitis, pneumonia, viral URI, allergies   Co morbidities that complicate the patient evaluation  None   Additional history obtained:  Additional history obtained from Nursing   External records from outside source obtained and reviewed including triage note   Lab Tests:  I Ordered, and personally interpreted labs.  The pertinent results include:   Respiratory panel negative    Medicines ordered and prescription drug management:  I ordered medication including Mucinex DM  for  cough, congestion  I have reviewed the patients home medicines and have made adjustments as needed     Problem List / ED Course:  Productive cough Nasal congestion Respiratory panel negative Maintain oxygen saturation without supplementation.  Well-appearing. Lung sounds CTAB.  No suspicion for pneumonia No difficulty swallowing.  Maintain secretions without difficulty.  No submandibular swelling or tenderness.  No exudates no posterior oropharyngeal erythema.  Low suspicion for tonsillar abscess, strep, [place] Provided Mucinex DM prescription for symptoms Discussed other symptomatic treatment at home Discussed return precautions   Reevaluation:  After the interventions noted above, I reevaluated the patient and found that they have :stayed the same   Social Determinants of Health:  Tobacco abuse No PCP follow-up   Dispostion:  After consideration of the diagnostic results and the patients response to treatment, I feel that the patent would benefit from outpatient management symptomatic treatment.   Discussed ED workup, disposition, return to ED precautions with patient who expresses understanding agrees with plan.  All questions answered to their satisfaction.  They are agreeable to plan.  Discharge instructions provided on paperwork  Final diagnoses:  Nasal congestion  Productive cough    ED Discharge Orders          Ordered    dextromethorphan-guaiFENesin [facility] DM) 30-600 MG 12hr tablet  2 times daily        08/03/24 1719             Minnie Tinnie BRAVO, PA 08/03/24 1731    Ula Prentice SAUNDERS, MD 08/03/24 2240

## 2024-08-03 NOTE — Discharge Instructions (Signed)
 Thank you for letting us  evaluate you today.  Your COVID, flu, RSV was negative.  I have sent Mucinex DM to your pharmacy.  This may make you drowsy.  Please take at home and see how you feel prior to driving or working.  Please make sure to continue with adequate fluid hydration, tea, ginger, honey.  You may also use cough drops as needed for coughing  Return to emergency room if you experience intractable vomiting causing be unable to keep fluids down, chest pain, shortness of breath, worsening symptoms.
